# Patient Record
Sex: Female | Born: 2000 | Race: Black or African American | Hispanic: No | Marital: Single | Smoking: Former smoker
Health system: Southern US, Community
[De-identification: ages and names within clinical notes are randomized; demographics above are authoritative.]

## PROBLEM LIST (undated history)

## (undated) ENCOUNTER — Inpatient Hospital Stay (HOSPITAL_COMMUNITY): Payer: Self-pay

## (undated) DIAGNOSIS — R45851 Suicidal ideations: Secondary | ICD-10-CM

## (undated) DIAGNOSIS — J45909 Unspecified asthma, uncomplicated: Secondary | ICD-10-CM

## (undated) DIAGNOSIS — F913 Oppositional defiant disorder: Secondary | ICD-10-CM

## (undated) DIAGNOSIS — F329 Major depressive disorder, single episode, unspecified: Secondary | ICD-10-CM

## (undated) HISTORY — PX: NO PAST SURGERIES: SHX2092

## (undated) HISTORY — PX: NASAL FRACTURE SURGERY: SHX718

---
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
## 2020-02-25 DIAGNOSIS — Z6379 Other stressful life events affecting family and household: Secondary | ICD-10-CM | POA: Diagnosis not present

## 2020-02-25 DIAGNOSIS — R399 Unspecified symptoms and signs involving the genitourinary system: Secondary | ICD-10-CM | POA: Diagnosis not present

## 2020-02-25 DIAGNOSIS — Z7251 High risk heterosexual behavior: Secondary | ICD-10-CM | POA: Diagnosis not present

## 2020-03-02 ENCOUNTER — Ambulatory Visit: Payer: Medicaid Other

## 2020-09-03 ENCOUNTER — Other Ambulatory Visit: Payer: Medicaid Other

## 2020-09-03 DIAGNOSIS — Z20822 Contact with and (suspected) exposure to covid-19: Secondary | ICD-10-CM

## 2020-09-05 LAB — SARS-COV-2, NAA 2 DAY TAT

## 2020-09-05 LAB — NOVEL CORONAVIRUS, NAA: SARS-CoV-2, NAA: NOT DETECTED

## 2020-09-15 IMAGING — US US MFM OB COMP + 14 WK
1 series · 13 of 28 positions shown · non-contrast
Comparison: none

[Series 1: us mfm ob comp + 14 wk · 13 of 78 slices shown]
[im 3/78]
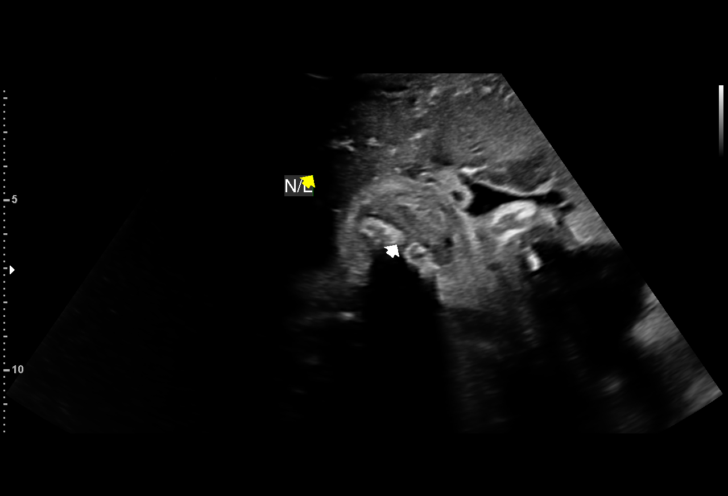
[im 9/78]
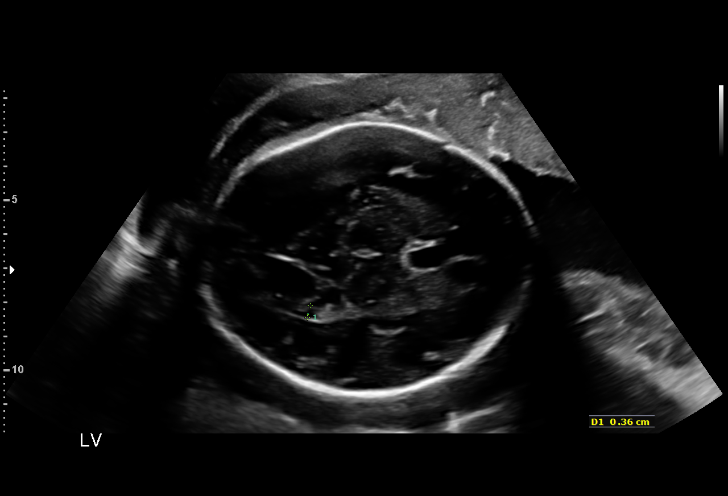
[im 15/78]
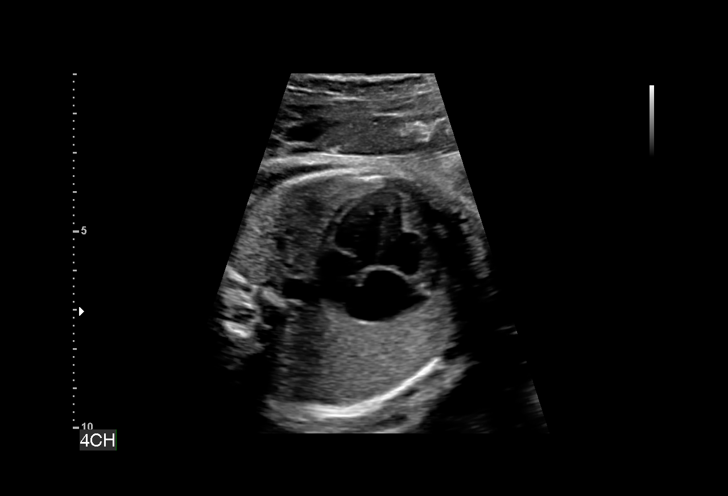
[im 20/78]
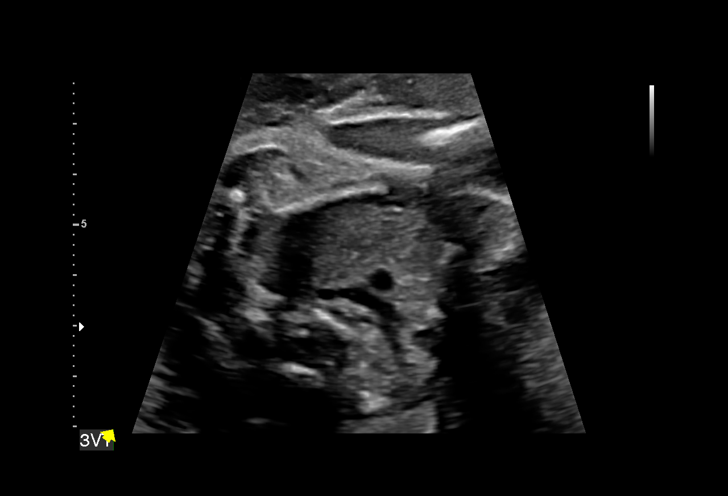
[im 26/78]
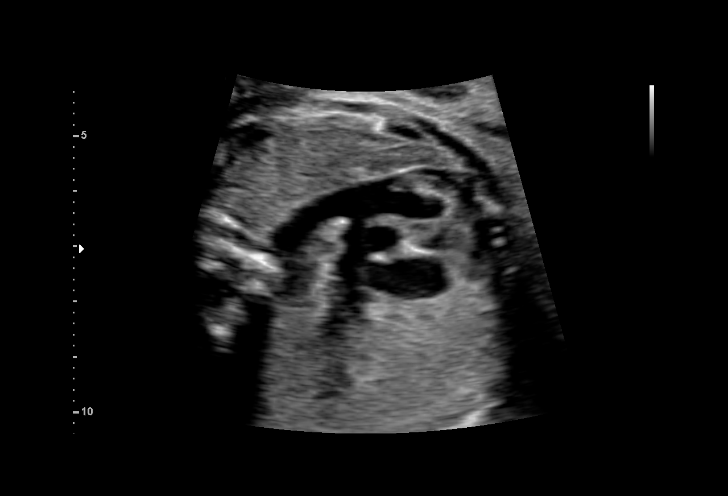
[im 32/78]
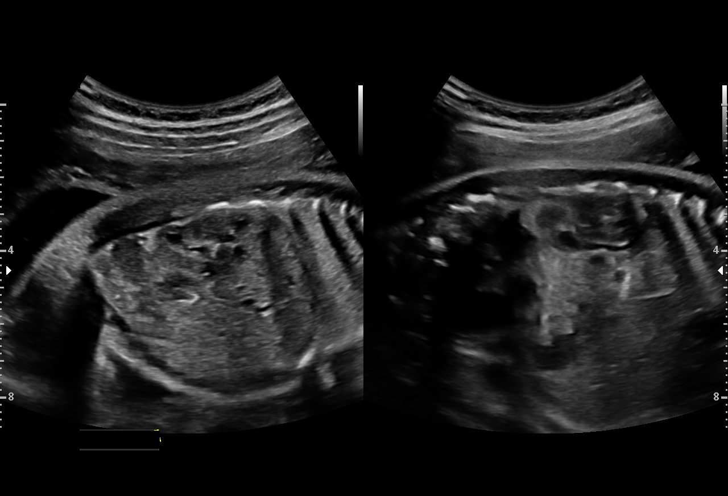
[im 40/78]
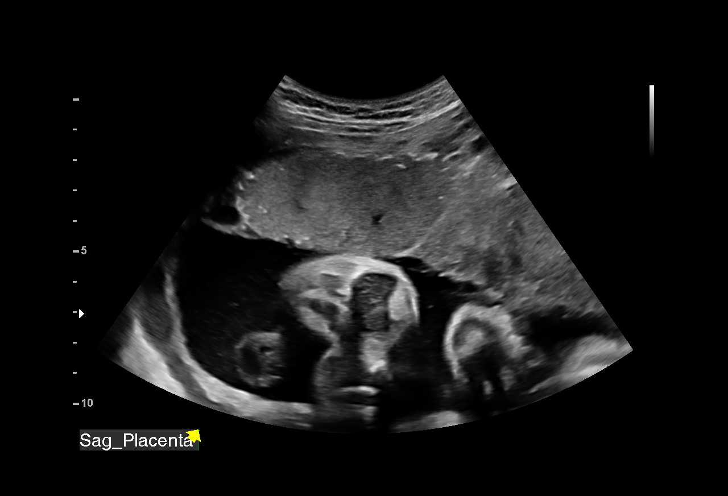
[im 46/78]
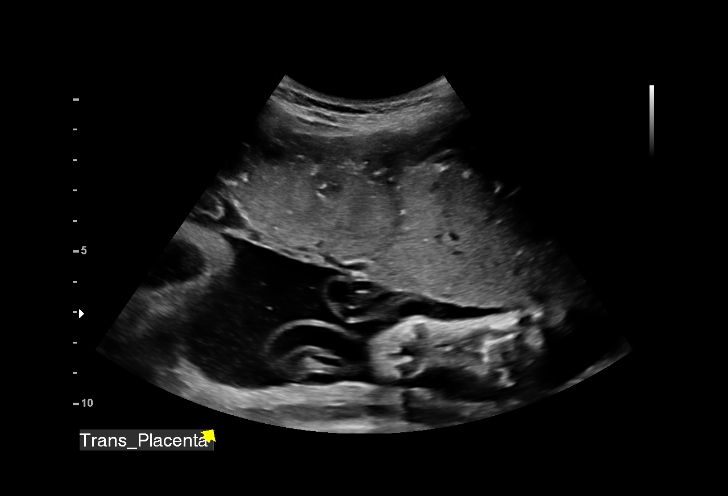
[im 52/78]
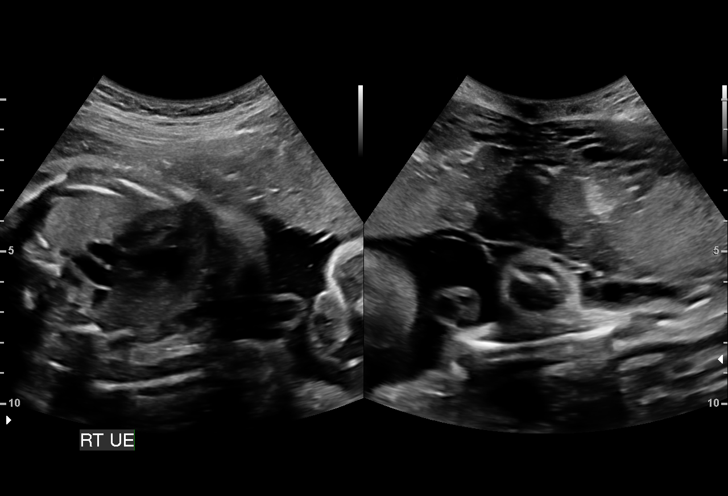
[im 58/78]
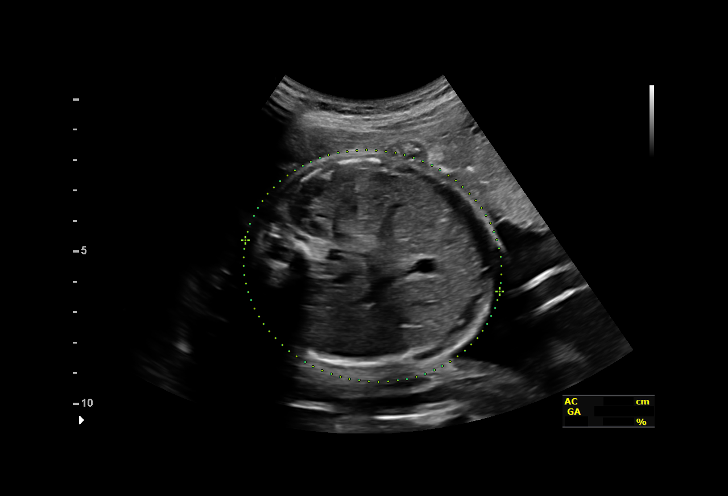
[im 63/78]
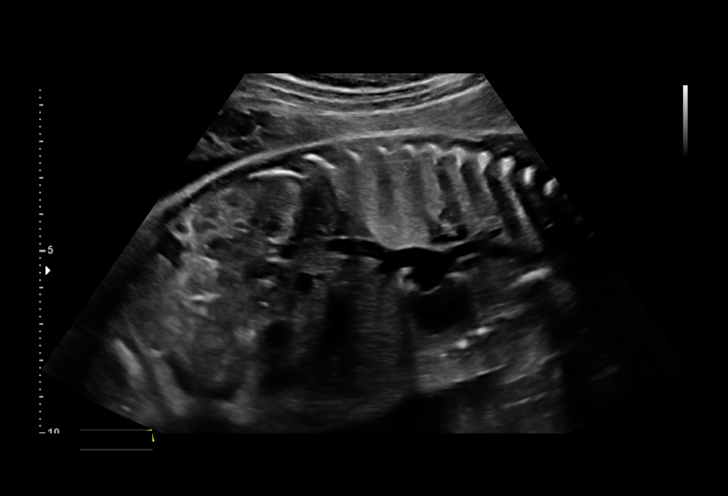
[im 69/78]
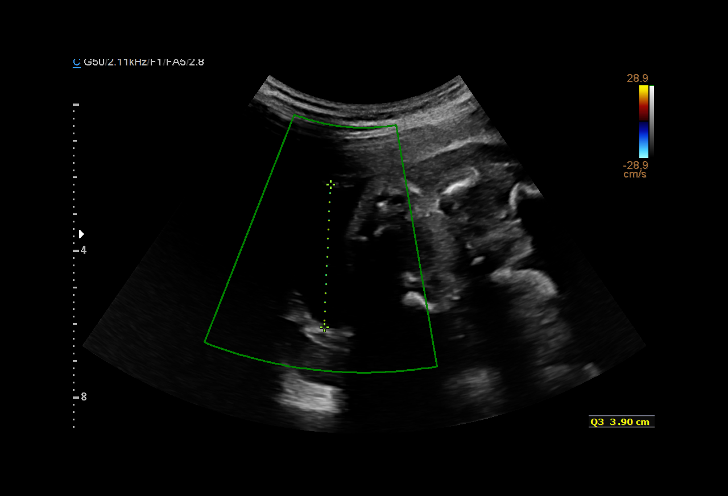
[im 75/78]
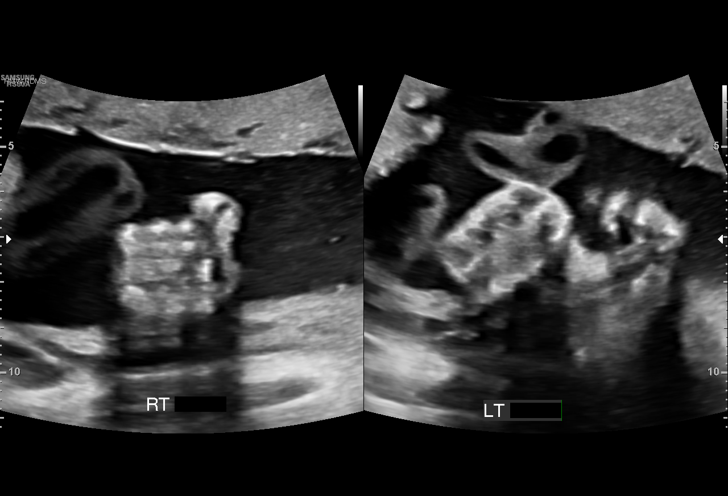

[13 of 28 positions shown; findings below may reference images not displayed]

----------------------------------------------------------------------

 ----------------------------------------------------------------------
Indications

  30 weeks gestation of pregnancy
  Encounter for antenatal screening for
  malformations (low risk NIPS)
  Teen pregnancy
  Insufficient Prenatal Care
 ----------------------------------------------------------------------
Fetal Evaluation

 Num Of Fetuses:         1
 Fetal Heart Rate(bpm):  128
 Cardiac Activity:       Observed
 Presentation:           Cephalic
 Placenta:               Anterior
 P. Cord Insertion:      Visualized, central

 Amniotic Fluid
 AFI FV:      Within normal limits

 AFI Sum(cm)     %Tile       Largest Pocket(cm)
 13.34           41

 RUQ(cm)       RLQ(cm)       LUQ(cm)        LLQ(cm)

Biometry

 BPD:      77.7  mm     G. Age:  31w 1d         71  %    CI:        74.02   %    70 - 86
                                                         FL/HC:      20.6   %    19.2 -
 HC:      286.8  mm     G. Age:  31w 3d         54  %    HC/AC:      1.13        0.99 -
 AC:      252.9  mm     G. Age:  29w 3d         26  %    FL/BPD:     75.9   %    71 - 87
 FL:         59  mm     G. Age:  30w 6d         54  %    FL/AC:      23.3   %    20 - 24
 HUM:      52.3  mm     G. Age:  30w 3d         59  %
 CER:      36.5  mm     G. Age:  31w 4d         65  %

 Est. FW:    7310  gm      3 lb 6 oz     55  %
OB History

 Gravidity:    1         Term:   0        Prem:   0        SAB:   0
 TOP:          0       Ectopic:  0        Living: 0
Gestational Age

 LMP:           30w 1d        Date:  09/02/18                 EDD:   06/09/19
 U/S Today:     30w 5d                                        EDD:   06/05/19
 Best:          30w 1d     Det. By:  LMP  (09/02/18)          EDD:   06/09/19
Anatomy

 Cranium:               Appears normal         Aortic Arch:            Appears normal
 Cavum:                 Appears normal         Ductal Arch:            Appears normal
 Ventricles:            Appears normal         Diaphragm:              Appears normal
 Choroid Plexus:        Appears normal         Stomach:                Appears normal, left
                                                                       sided
 Cerebellum:            Appears normal         Abdomen:                Appears normal
 Posterior Fossa:       Appears normal         Abdominal Wall:         Appears nml (cord
                                                                       insert, abd wall)
 Nuchal Fold:           Not applicable (>20    Cord Vessels:           Appears normal (3
                        wks GA)                                        vessel cord)
 Face:                  Appears normal         Kidneys:                Appear normal
                        (orbits and profile)
 Lips:                  Appears normal         Bladder:                Appears normal
 Thoracic:              Appears normal         Spine:                  Appears normal
 Heart:                 Appears normal         Upper Extremities:      Appears normal
                        (4CH, axis, and
                        situs)
 RVOT:                  Appears normal         Lower Extremities:      Appears normal
 LVOT:                  Appears normal

 Other:  3VV and 3VTV visualized. Hands visualized. Feet visualized.
Cervix Uterus Adnexa

 Cervix
 Normal appearance by transabdominal scan.

 Uterus
 No abnormality visualized.

 Left Ovary
 No adnexal mass visualized.

 Right Ovary
 No adnexal mass visualized.

 Cul De Sac
 No free fluid seen.

 Adnexa
 No abnormality visualized.
Impression

 Normal interval growth.  No ultrasonic evidence of structural
 fetal anomalies.
 Teen pregnancy
 Anatomy completed.
Recommendations

 Follow up as clinically indicated.

## 2020-10-27 DIAGNOSIS — J45901 Unspecified asthma with (acute) exacerbation: Secondary | ICD-10-CM | POA: Diagnosis not present

## 2020-11-11 DIAGNOSIS — N898 Other specified noninflammatory disorders of vagina: Secondary | ICD-10-CM | POA: Diagnosis not present

## 2020-11-11 DIAGNOSIS — Z113 Encounter for screening for infections with a predominantly sexual mode of transmission: Secondary | ICD-10-CM | POA: Diagnosis not present

## 2020-11-15 DIAGNOSIS — N760 Acute vaginitis: Secondary | ICD-10-CM | POA: Diagnosis not present

## 2020-11-15 DIAGNOSIS — B9689 Other specified bacterial agents as the cause of diseases classified elsewhere: Secondary | ICD-10-CM | POA: Diagnosis not present

## 2020-11-15 DIAGNOSIS — A5600 Chlamydial infection of lower genitourinary tract, unspecified: Secondary | ICD-10-CM | POA: Diagnosis not present

## 2021-01-10 DIAGNOSIS — Z7251 High risk heterosexual behavior: Secondary | ICD-10-CM | POA: Diagnosis not present

## 2021-01-10 DIAGNOSIS — R300 Dysuria: Secondary | ICD-10-CM | POA: Diagnosis not present

## 2021-01-10 DIAGNOSIS — R8290 Unspecified abnormal findings in urine: Secondary | ICD-10-CM | POA: Diagnosis not present

## 2021-01-10 DIAGNOSIS — Z202 Contact with and (suspected) exposure to infections with a predominantly sexual mode of transmission: Secondary | ICD-10-CM | POA: Diagnosis not present

## 2021-05-18 ENCOUNTER — Emergency Department (HOSPITAL_BASED_OUTPATIENT_CLINIC_OR_DEPARTMENT_OTHER)
Admission: EM | Admit: 2021-05-18 | Discharge: 2021-05-19 | Disposition: A | Discharge status: Home / Self Care | Payer: Medicaid Other | Source: Home / Self Care | Attending: Emergency Medicine | Admitting: Emergency Medicine

## 2021-05-18 ENCOUNTER — Emergency Department (HOSPITAL_BASED_OUTPATIENT_CLINIC_OR_DEPARTMENT_OTHER): Payer: Medicaid Other

## 2021-05-18 ENCOUNTER — Encounter (HOSPITAL_COMMUNITY): Payer: Self-pay | Admitting: Emergency Medicine

## 2021-05-18 ENCOUNTER — Encounter (HOSPITAL_BASED_OUTPATIENT_CLINIC_OR_DEPARTMENT_OTHER): Payer: Self-pay

## 2021-05-18 ENCOUNTER — Other Ambulatory Visit: Payer: Self-pay

## 2021-05-18 ENCOUNTER — Emergency Department (HOSPITAL_COMMUNITY)
Admission: EM | Admit: 2021-05-18 | Discharge: 2021-05-18 | Discharge status: Against Medical Advice | Payer: Medicaid Other | Attending: Emergency Medicine | Admitting: Emergency Medicine

## 2021-05-18 DIAGNOSIS — N838 Other noninflammatory disorders of ovary, fallopian tube and broad ligament: Secondary | ICD-10-CM

## 2021-05-18 DIAGNOSIS — J45909 Unspecified asthma, uncomplicated: Secondary | ICD-10-CM | POA: Insufficient documentation

## 2021-05-18 DIAGNOSIS — R1030 Lower abdominal pain, unspecified: Secondary | ICD-10-CM | POA: Diagnosis not present

## 2021-05-18 DIAGNOSIS — N83201 Unspecified ovarian cyst, right side: Secondary | ICD-10-CM | POA: Diagnosis not present

## 2021-05-18 DIAGNOSIS — R6883 Chills (without fever): Secondary | ICD-10-CM | POA: Insufficient documentation

## 2021-05-18 DIAGNOSIS — R1031 Right lower quadrant pain: Secondary | ICD-10-CM | POA: Insufficient documentation

## 2021-05-18 DIAGNOSIS — K5900 Constipation, unspecified: Secondary | ICD-10-CM | POA: Insufficient documentation

## 2021-05-18 DIAGNOSIS — G43909 Migraine, unspecified, not intractable, without status migrainosus: Secondary | ICD-10-CM | POA: Diagnosis not present

## 2021-05-18 DIAGNOSIS — Z7951 Long term (current) use of inhaled steroids: Secondary | ICD-10-CM | POA: Insufficient documentation

## 2021-05-18 DIAGNOSIS — Z5321 Procedure and treatment not carried out due to patient leaving prior to being seen by health care provider: Secondary | ICD-10-CM | POA: Insufficient documentation

## 2021-05-18 DIAGNOSIS — R102 Pelvic and perineal pain: Secondary | ICD-10-CM | POA: Diagnosis not present

## 2021-05-18 LAB — LIPASE, BLOOD: Lipase: 25 U/L (ref 11–51)

## 2021-05-18 LAB — COMPREHENSIVE METABOLIC PANEL
ALT: 10 U/L (ref 0–44)
AST: 13 U/L — ABNORMAL LOW (ref 15–41)
Albumin: 3.5 g/dL (ref 3.5–5.0)
Alkaline Phosphatase: 53 U/L (ref 38–126)
Anion gap: 8 (ref 5–15)
BUN: 6 mg/dL (ref 6–20)
CO2: 24 mmol/L (ref 22–32)
Calcium: 9 mg/dL (ref 8.9–10.3)
Chloride: 105 mmol/L (ref 98–111)
Creatinine, Ser: 0.7 mg/dL (ref 0.44–1.00)
GFR, Estimated: >60 mL/min (ref >60)
Glucose, Bld: 93 mg/dL (ref 70–99)
Potassium: 3.8 mmol/L (ref 3.5–5.1)
Sodium: 137 mmol/L (ref 135–145)
Total Bilirubin: 1.3 mg/dL — ABNORMAL HIGH (ref 0.3–1.2)
Total Protein: 7.4 g/dL (ref 6.5–8.1)

## 2021-05-18 LAB — CBC WITH DIFFERENTIAL/PLATELET
Abs Immature Granulocytes: 0.04 10*3/uL (ref 0.00–0.07)
Basophils Absolute: 0 10*3/uL (ref 0.0–0.1)
Basophils Relative: 0 %
Eosinophils Absolute: 0 10*3/uL (ref 0.0–0.5)
Eosinophils Relative: 0 %
HCT: 40.8 % (ref 36.0–46.0)
Hemoglobin: 12.8 g/dL (ref 12.0–15.0)
Immature Granulocytes: 1 %
Lymphocytes Relative: 17 %
Lymphs Abs: 1.4 10*3/uL (ref 0.7–4.0)
MCH: 27.3 pg (ref 26.0–34.0)
MCHC: 31.4 g/dL (ref 30.0–36.0)
MCV: 87 fL (ref 80.0–100.0)
Monocytes Absolute: 1.1 10*3/uL — ABNORMAL HIGH (ref 0.1–1.0)
Monocytes Relative: 13 %
Neutro Abs: 5.9 10*3/uL (ref 1.7–7.7)
Neutrophils Relative %: 69 %
Platelets: 312 10*3/uL (ref 150–400)
RBC: 4.69 MIL/uL (ref 3.87–5.11)
RDW: 14.7 % (ref 11.5–15.5)
WBC: 8.5 10*3/uL (ref 4.0–10.5)
nRBC: 0 % (ref 0.0–0.2)

## 2021-05-18 LAB — URINALYSIS, ROUTINE W REFLEX MICROSCOPIC
Bilirubin Urine: NEGATIVE
Glucose, UA: NEGATIVE mg/dL
Ketones, ur: 20 mg/dL — AB
Nitrite: NEGATIVE
Protein, ur: 30 mg/dL — AB
Specific Gravity, Urine: 1.029 (ref 1.005–1.030)
pH: 6 (ref 5.0–8.0)

## 2021-05-18 LAB — HCG, QUANTITATIVE, PREGNANCY: hCG, Beta Chain, Quant, S: 1 m[IU]/mL (ref <5)

## 2021-05-18 MED ORDER — ONDANSETRON HCL 4 MG/2ML IJ SOLN
4.0000 mg | Freq: Once | INTRAMUSCULAR | Status: AC
  Administered 2021-05-19: 4 mg via INTRAVENOUS
  Filled 2021-05-18: qty 2

## 2021-05-18 MED ORDER — ACETAMINOPHEN 500 MG PO TABS
1000.0000 mg | ORAL_TABLET | Freq: Once | ORAL | Status: AC
  Administered 2021-05-18: 1000 mg via ORAL
  Filled 2021-05-18: qty 2

## 2021-05-18 MED ORDER — MORPHINE SULFATE (PF) 4 MG/ML IV SOLN
4.0000 mg | Freq: Once | INTRAVENOUS | Status: AC
  Administered 2021-05-19: 4 mg via INTRAVENOUS
  Filled 2021-05-18: qty 1

## 2021-05-18 MED ORDER — IOHEXOL 300 MG/ML  SOLN
100.0000 mL | Freq: Once | INTRAMUSCULAR | Status: AC | PRN
  Administered 2021-05-18: 75 mL via INTRAVENOUS

## 2021-05-18 MED ORDER — SODIUM CHLORIDE 0.9 % IV SOLN: Freq: Once | INTRAVENOUS | Status: AC

## 2021-05-18 NOTE — ED Triage Notes (Signed)
Pt endorses lower abd pain and migraine since Monday. Last BM yesterday.

## 2021-05-18 NOTE — ED Provider Notes (Signed)
Emergency Medicine Provider Triage Evaluation Note  Dawn Hudson , a 20 y.o. female  was evaluated in triage.  Pt complains of lower abdominal pain since Sunday.  Pain is worse with patient, patient.  This weekend.  She is febrile, but denies any chills.  No associated nausea, vomiting, hematuria, dysuria.  No previous abdominal surgeries.  Review of Systems  Positive: Abdominal pain  Negative: above  Physical Exam  BP 114/63 (BP Location: Right Arm)   Pulse 92   Temp (!) 100.5 F (38.1 C) (Oral)   Resp 18   Ht 5\' 3"  (1.6 m)   Wt 52.6 kg   SpO2 98%   BMI 20.55 kg/m  Gen:   Awake, no distress   Resp:  Normal effort  MSK:   Moves extremities without difficulty  Other:  Referral quadrant, right lower quadrant, suprapubic tenderness.  No CVA tenderness  Medical Decision Making  Medically screening exam initiated at 11:44 AM.  Appropriate orders placed.  Dawn Hudson was informed that the remainder of the evaluation will be completed by another provider, this initial triage assessment does not replace that evaluation, and the importance of remaining in the ED until their evaluation is complete.     , PA-C 05/18/21 1146    05/20/21, MD 05/19/21 [phone]

## 2021-05-18 NOTE — ED Notes (Signed)
Triage RN notified of pts BP.

## 2021-05-18 NOTE — ED Provider Notes (Signed)
[facility] EMERGENCY DEPARTMENT Provider Note   CSN: [id number] Arrival date & time: 05/18/21  1951     History Chief Complaint  Patient presents with   Abdominal Pain    Dawn Hudson is a 20 y.o. female with medical history as noted below.  Patient presents emergency department with a chief complaint of abdominal pain.  Patient states that abdominal pain is located to lower left and lower right quadrant.  Pain started on Sunday 7/24.  Pain has been constant since then.  Pain has been unchanged.  Patient rates pain 9/10 on the pain scale.  Patient describes pain as a "aching."  Patient reports that pain is worse with movement.  Patient has not tried any modalities to alleviate her pain.  Patient endorses associated decreased appetite, chills and constipation.  Last bowel movement occurred last Wednesday.  Patient states that she has been passing flatus.  Patient denies any history of abdominal surgeries.  G1 P1-0-0-1.  LMP 7/20  Patient denies any dysuria, hematuria, urinary frequency, vaginal pain, vaginal bleeding, vaginal discharge, nausea, vomiting, diarrhea, chest pain, shortness of breath.    Patient reports that she went to [facility] emergency department earlier today where she had lab work drawn.  Patient left prior to being seen by a provider.   Abdominal Pain Associated symptoms: chills and constipation   Associated symptoms: no chest pain, no diarrhea, no dysuria, no fever, no hematuria, no nausea, no shortness of breath, no vaginal bleeding, no vaginal discharge and no vomiting       Past Medical History:  Diagnosis Date   Asthma    Depressive disorder 04/25/2017   Oppositional defiant disorder, moderate 04/25/2017   Suicidal ideation 04/25/2017    Patient Active Problem List   Diagnosis Date Noted   Post-dates pregnancy 06/08/2019   Encounter for supervision of normal first pregnancy in first trimester 12/03/2018   DMDD (disruptive mood dysregulation disorder)  (HCC) 04/25/2017   Depressive disorder 04/25/2017   Oppositional defiant disorder, moderate 04/25/2017   Suicidal ideation 04/25/2017    Past Surgical History:  Procedure Laterality Date   NASAL FRACTURE SURGERY     NO PAST SURGERIES       OB History     Gravida  1   Para  1   Term  1   Preterm      AB      Living  1      SAB      IAB      Ectopic      Multiple  0   Live Births  1           Family History  Problem Relation Age of Onset   Healthy Mother    Healthy Father    Cancer Neg Hx    Diabetes Neg Hx    Hypertension Neg Hx     Social History   Tobacco Use   Smoking status: Never   Smokeless tobacco: Never  Vaping Use   Vaping Use: Never used  Substance Use Topics   Alcohol use: No   Drug use: No    Home Medications Prior to Admission medications   Medication Sig Start Date End Date Taking? Authorizing Provider  albuterol (PROVENTIL HFA;VENTOLIN HFA) 108 (90 Base) MCG/ACT inhaler Inhale 1-2 puffs into the lungs every 6 (six) hours as needed for wheezing or shortness of breath. 04/16/16   Palumbo, April, MD  beclomethasone (QVAR) 80 MCG/ACT inhaler Inhale 2 puffs into the  lungs 2 (two) times daily.    [provider]  cetirizine (ZYRTEC) 10 MG tablet TK 1 T PO D 08/27/18   [provider]  Elastic Bandages & Supports (COMFORT FIT MATERNITY SUPP SM) MISC 1 Units by Does not apply route daily as needed. 05/14/19   Raelyn Mora, CNM  fluticasone (FLONASE) 50 MCG/ACT nasal spray INHALE 1 PUFFS IN EACH NOSTRIL BID 07/08/18   [provider]  ibuprofen (ADVIL) 600 MG tablet Take 1 tablet (600 mg total) by mouth every 6 (six) hours as needed for moderate pain. 06/10/19   Sharyon Cable, CNM  Prenat w/o A-FE-Methfol-FA-DHA (PRENATE DHA) 28-0.6-0.4-300 MG CAPS Take 1 capsule by mouth daily. Patient not taking: Reported on 12/16/2019 11/05/18   Aviva Signs, CNM  Prenat-FeAsp-Meth-FA-DHA w/o A (PRENATE DHA)  18-0.6-0.4-300 MG CAPS TK ONE C PO D 11/18/18   [provider]  sodium chloride (OCEAN) 0.65 % SOLN nasal spray Place 1 spray into both nostrils as needed for congestion. 11/15/18   Calvert Cantor, CNM  SYMBICORT 80-4.5 MCG/ACT inhaler INHALE 2 PUFFS PO BID IN THE MORNING AND EVENING 07/08/18   [provider]    Allergies    Patient has no known allergies.  Review of Systems   Review of Systems  Constitutional:  Positive for appetite change (decreased) and chills. Negative for fever.  Eyes:  Negative for visual disturbance.  Respiratory:  Negative for shortness of breath.   Cardiovascular:  Negative for chest pain.  Gastrointestinal:  Positive for abdominal pain and constipation. Negative for abdominal distention, anal bleeding, diarrhea, nausea and vomiting.  Genitourinary:  Negative for difficulty urinating, dysuria, flank pain, frequency, hematuria, vaginal bleeding, vaginal discharge and vaginal pain.  Musculoskeletal:  Negative for back pain and neck pain.  Skin:  Negative for color change and rash.  Neurological:  Negative for dizziness, syncope, light-headedness and headaches.  Psychiatric/Behavioral:  Negative for confusion.    Physical Exam Updated Vital Signs BP 122/70 (BP Location: Right Arm)   Pulse 97   Temp 99.8 F (37.7 C) (Oral)   Resp 18   Ht 5\' 3"  (1.6 m)   Wt 53.5 kg   LMP 05/11/2021   SpO2 100%   BMI 20.90 kg/m   Physical Exam Vitals and nursing note reviewed.  Constitutional:      General: She is not in acute distress.    Appearance: She is not ill-appearing, toxic-appearing or diaphoretic.  HENT:     Head: Normocephalic.  Eyes:     General: No scleral icterus.       Right eye: No discharge.        Left eye: No discharge.  Cardiovascular:     Rate and Rhythm: Normal rate.  Pulmonary:     Effort: Pulmonary effort is normal. No tachypnea, bradypnea or respiratory distress.  Abdominal:     General: Bowel sounds are normal.  There is no distension. There are no signs of injury.     Palpations: Abdomen is soft. There is no mass or pulsatile mass.     Tenderness: There is abdominal tenderness in the right lower quadrant and left lower quadrant. There is no right CVA tenderness, left CVA tenderness, guarding or rebound.     Hernia: There is no hernia in the umbilical area or ventral area.     Comments: Tenderness to right lower left lower quadrant.  Pain is worse to right lower quadrant.  Skin:    General: Skin is warm and  dry.  Neurological:     General: No focal deficit present.     Mental Status: She is alert.  Psychiatric:        Behavior: Behavior is cooperative.    ED Results / Procedures / Treatments   Labs (all labs ordered are listed, but only abnormal results are displayed) Labs Reviewed - No data to display     EKG None  Radiology CT ABDOMEN PELVIS W CONTRAST  Result Date: 05/18/2021 CLINICAL DATA:  Right lower quadrant pain, constipation and urinary frequency EXAM: CT ABDOMEN AND PELVIS WITH CONTRAST TECHNIQUE: Multidetector CT imaging of the abdomen and pelvis was performed using the standard protocol following bolus administration of intravenous contrast. CONTRAST:  75mL OMNIPAQUE IOHEXOL 300 MG/ML  SOLN COMPARISON:  None FINDINGS: Lower chest: No acute abnormality in the upper chest or imaged lung apices. Hepatobiliary: No worrisome focal liver lesions. Smooth liver surface contour. Normal hepatic attenuation. Normal gallbladder and biliary tree. Pancreas: No pancreatic ductal dilatation or surrounding inflammatory changes. Spleen: Normal in size. No concerning splenic lesions. Adrenals/Urinary Tract: Normal adrenal glands. Kidneys are normally located with symmetric enhancement. No suspicious renal lesion, urolithiasis or hydronephrosis. Urinary bladder is largely decompressed at the time of exam and therefore poorly evaluated by CT imaging. Mild bladder wall thickening likely related to  underdistention. Stomach/Bowel: Challenging assessment of the bowel and mesentery given a paucity of mesenteric fat and mass effect from the large fluid structure in the pelvis. Distal esophagus, stomach and duodenal sweep are unremarkable. No small bowel wall thickening or dilatation. No evidence of obstruction. Normal appearing air-filled appendix seen coiling in retrocecal position from the cecal tip, along the inferior margin of the right kidney. No focal appendiceal inflammation to suggest acute appendicitis. No colonic dilatation or wall thickening. Compression of the sigmoid colon by the large pelvic structure. No high-grade bowel obstruction is seen. Vascular/Lymphatic: No significant vascular findings are present. No visible enlarged abdominal or pelvic lymph nodes with evaluation complicated by the absence of intraperitoneal fat. Reproductive: Large fluid attenuation structure in the pelvis which appears to extend from the right adnexa measuring approximately 14.3 x 10.9 x 9.5 cm in maximum dimensions with slightly elongated projection anteriorly which could reflect a tubular origin such as a large hydrosalpinx however this is incompletely characterized on this exam. Furthermore, there is surrounding thickening and edematous changes as well as prominence of the right parametrial vasculature including several vessels which demonstrates some questionable thickening particular coronal reconstructions (46-55). Uterus is displaced left laterally. No concerning left adnexal lesion. Other: Small to moderate volume low-attenuation free fluid in the deep pelvis and right adnexa. Inflammatory changes adjacent the large fluid attenuation structure in the pelvis as well. Displacement of the pelvic viscera. No bowel containing hernia. Musculoskeletal: No acute osseous abnormality or suspicious osseous lesion. IMPRESSION: Large fluid attenuation structure which appears to arise from the right adnexa demonstrates some  questionable tubular appearance anteriorly, could reflect a large hydrosalpinx though other cystic adnexal masses/cystic ovarian neoplasm is not fully excluded. Is incompletely characterized on this exam. Furthermore, there is some notable prominence of the right parametrial vasculature and questionable twisting of the vessels in the right adnexa. Torsion is not fully excluded particularly given the setting of acute onset pelvic pain. Consider further interrogation with pelvic ultrasound with Doppler though ultimately the structure itself may require more complete characterization with contrast enhanced MR imaging. Displacement of the pelvic viscera and some compression of the distal sigmoid colon albeit without high-grade obstruction. Normal appendix.  These results were called by telephone at the time of interpretation on 05/18/2021 at 11:28 pm to provider [facility] , who verbally acknowledged these results. Electronically Signed   By: Kreg Shropshire M.D.   On: 05/18/2021 23:28    Procedures Procedures   Medications Ordered in ED Medications - No data to display  ED Course  I have reviewed the triage vital signs and the nursing notes.  Pertinent labs & imaging results that were available during my care of the patient were reviewed by me and considered in my medical decision making (see chart for details).  Clinical Course as of 05/19/21 0026  Wed May 18, 2021  2324 Spoke to radiology; [PB]    Clinical Course User Index [PB] Berneice Heinrich   MDM Rules/Calculators/A&P                           Alert 20 year old female no acute distress, nontoxic-appearing.  Presents with chief complaint of lower quadrant abdominal pain.  Pain has been present since 7/25.  Pain has been unchanged since then.  [facility] and had lab work obtained.  Patient left before being seen by provider.  CBC shows no leukocytosis or anemia CMP is  unremarkable Lipase within normal limits Quantitative hCG less than 1 UA shows bacteria few, squamous epithelial 11-20, WBC 6-10, RBC 11-20, leukocytes trace, nitrite negative.  Patient denies any urinary symptoms, suspect that few bacteria seen is due to contamination.  Due to patient's tenderness to right lower quadrant on exam concern for possible appendicitis.  Will obtain CT abdomen pelvis with contrast.  Patient was given Tylenol for pain management.  Patient was offered opiates however refused at this time.  CT of abdomen and pelvis shows: Large fluid attenuation structure in the pelvis which appears to extend from the right adnexa measuring approximately 14.3 x 10.9 x 9.5 cm in maximum dimensions. This could represent large hydrosalpinx however other cystic dental masses, or cystic ovarian neoplasm is not excluded.  Will order ultrasound imaging to rule out ovarian torsion as well as gain better characterization of mass.  Spoke to on-call OB/GYN provider Dr. Arlyn Leak, who reported that patient can follow-up with this clinic in the outpatient setting.    Patient care transferred to Dr.Floyd at the end of my shift. Patient presentation, ED course, and plan of care discussed with review of all pertinent labs and imaging. Please see his/her note for further details regarding further ED course and disposition.   Final Clinical Impression(s) / ED Diagnoses Final diagnoses:  None    Rx / DC Orders ED Discharge Orders     None        Haskel Schroeder, PA-C 05/19/21 0036    Melene Plan, DO 05/19/21 0147

## 2021-05-18 NOTE — ED Triage Notes (Addendum)
Pt c/o lower abd pain, constipation, urinary freq-sx started 7/24-NAD-steady gait-pt states she LWBS [place]-results in epic

## 2021-05-19 DIAGNOSIS — N83201 Unspecified ovarian cyst, right side: Secondary | ICD-10-CM | POA: Diagnosis not present

## 2021-05-19 DIAGNOSIS — R102 Pelvic and perineal pain: Secondary | ICD-10-CM | POA: Diagnosis not present

## 2021-05-19 MED ORDER — MORPHINE SULFATE 15 MG PO TABS: 7.5000 mg | ORAL_TABLET | ORAL | 0 refills | Status: DC | PRN

## 2021-05-19 NOTE — Discharge Instructions (Addendum)

## 2021-05-20 ENCOUNTER — Telehealth: Payer: Self-pay

## 2021-05-20 NOTE — Telephone Encounter (Signed)
Transition Care Management Follow-up Telephone Call Date of discharge and from where: 05/19/2021-[facility] How have you been since you were released from the hospital? Patient stated she is still in pain. Any questions or concerns? Yes patient stated she is suppose to be scheduled for surgery but there is no appointment scheduled. Patient was advised to follow up with OBGYN  per ED discharge note.   Items Reviewed: Did the pt receive and understand the discharge instructions provided? Yes  Medications obtained and verified? Yes  Other? No  Any new allergies since your discharge? No  Dietary orders reviewed? N/A Do you have support at home? Yes   Home Care and Equipment/Supplies: Were home health services ordered? not applicable If so, what is the name of the agency? N/A  Has the agency set up a time to come to the patient's home? not applicable Were any new equipment or medical supplies ordered?  No What is the name of the medical supply agency? N/A Were you able to get the supplies/equipment? not applicable Do you have any questions related to the use of the equipment or supplies? No  Functional Questionnaire: (I = Independent and D = Dependent) ADLs: I  Bathing/Dressing- I  Meal Prep- I  Eating- I  Maintaining continence- I  Transferring/Ambulation- I  Managing Meds- I  Follow up appointments reviewed:  PCP Hospital f/u appt confirmed? No   Specialist Hospital f/u appt confirmed? No   Are transportation arrangements needed? No  If their condition worsens, is the pt aware to call PCP or go to the Emergency Dept.? Yes Was the patient provided with contact information for the PCP's office or ED? Yes Was to pt encouraged to call back with questions or concerns? Yes

## 2021-06-06 ENCOUNTER — Encounter: Payer: Self-pay | Admitting: Obstetrics & Gynecology

## 2021-06-06 ENCOUNTER — Ambulatory Visit (INDEPENDENT_AMBULATORY_CARE_PROVIDER_SITE_OTHER): Payer: Medicaid Other | Admitting: Obstetrics & Gynecology

## 2021-06-06 ENCOUNTER — Other Ambulatory Visit: Payer: Self-pay

## 2021-06-06 VITALS — BP 177/56 | HR 84 | Ht 63.0 in | Wt 121.7 lb

## 2021-06-06 DIAGNOSIS — R1900 Intra-abdominal and pelvic swelling, mass and lump, unspecified site: Secondary | ICD-10-CM

## 2021-06-06 NOTE — Progress Notes (Signed)
Patient ID: Dawn Hudson, female   DOB: 14-May-2001, 20 y.o.   MRN: [id number]  Chief Complaint  Patient presents with   Gynecologic Exam    HPI Dawn Hudson is a 20 y.o. female.  F/u after ED visit for pelvic pain with CT and US showing large fluid filled pelvic mass on the right. Her pain lasted a week and has resolved. HPI  Past Medical History:  Diagnosis Date   Asthma    Depressive disorder 04/25/2017   Oppositional defiant disorder, moderate 04/25/2017   Suicidal ideation 04/25/2017    Past Surgical History:  Procedure Laterality Date   NASAL FRACTURE SURGERY     NO PAST SURGERIES      Family History  Problem Relation Age of Onset   Healthy Mother    Healthy Father    Cancer Neg Hx    Diabetes Neg Hx    Hypertension Neg Hx     Social History Social History   Tobacco Use   Smoking status: Never   Smokeless tobacco: Never  Vaping Use   Vaping Use: Never used  Substance Use Topics   Alcohol use: No   Drug use: No    No Known Allergies  Current Outpatient Medications  Medication Sig Dispense Refill   albuterol (PROVENTIL HFA;VENTOLIN HFA) 108 (90 Base) MCG/ACT inhaler Inhale 1-2 puffs into the lungs every 6 (six) hours as needed for wheezing or shortness of breath. 1 Inhaler 0   beclomethasone (QVAR) 80 MCG/ACT inhaler Inhale 2 puffs into the lungs 2 (two) times daily.     cetirizine (ZYRTEC) 10 MG tablet TK 1 T PO D     fluticasone (FLONASE) 50 MCG/ACT nasal spray INHALE 1 PUFFS IN EACH NOSTRIL BID     ibuprofen (ADVIL) 600 MG tablet Take 1 tablet (600 mg total) by mouth every 6 (six) hours as needed for moderate pain. 30 tablet 0   morphine (MSIR) 15 MG tablet Take 0.5 tablets (7.5 mg total) by mouth every 4 (four) hours as needed for severe pain. 5 tablet 0   sodium chloride (OCEAN) 0.65 % SOLN nasal spray Place 1 spray into both nostrils as needed for congestion. 1 Bottle 0   SYMBICORT 80-4.5 MCG/ACT inhaler INHALE 2 PUFFS PO BID IN THE MORNING AND EVENING      Elastic Bandages & Supports (COMFORT FIT MATERNITY SUPP SM) MISC 1 Units by Does not apply route daily as needed. (Patient not taking: Reported on 06/06/2021) 1 each 0   nitrofurantoin, macrocrystal-monohydrate, (MACROBID) 100 MG capsule Take 100 mg by mouth 2 (two) times daily.     Prenat w/o A-FE-Methfol-FA-DHA (PRENATE DHA) 28-0.6-0.4-300 MG CAPS Take 1 capsule by mouth daily. (Patient not taking: No sig reported) 30 capsule 12   Prenat-FeAsp-Meth-FA-DHA w/o A (PRENATE DHA) 18-0.6-0.4-300 MG CAPS TK ONE C PO D (Patient not taking: Reported on 06/06/2021)     Current Facility-Administered Medications  Medication Dose Route Frequency Provider Last Rate Last Admin   medroxyPROGESTERone (DEPO-PROVERA) injection 150 mg  150 mg Intramuscular Q90 days Raelyn Mora, CNM   150 mg at 12/16/19 1101    Review of Systems Review of Systems  Respiratory: Negative.    Gastrointestinal: Negative.   Genitourinary:  Positive for frequency. Negative for pelvic pain, vaginal bleeding and vaginal discharge.   Blood pressure (!) 177/56, pulse 84, height 5\' 3"  (1.6 m), weight 121 lb 11.2 oz (55.2 kg), last menstrual period 05/13/2021, not currently breastfeeding.  Physical Exam Physical Exam Vitals and nursing note  reviewed. Exam conducted with a chaperone present.  Constitutional:      Appearance: Normal appearance.  Pulmonary:     Effort: Pulmonary effort is normal.  Abdominal:     General: Abdomen is flat. There is no distension.     Palpations: Abdomen is soft. There is no mass.     Tenderness: There is no abdominal tenderness.  Genitourinary:    General: Normal vulva.     Exam position: Lithotomy position.     Uterus: Normal.      Adnexa: Left adnexa normal.       Right: Mass present.   Neurological:     Mental Status: She is alert.    Data Reviewed Narrative & Impression  CLINICAL DATA:  Right lower quadrant pain, constipation and urinary frequency   EXAM: CT ABDOMEN AND PELVIS WITH  CONTRAST   TECHNIQUE: Multidetector CT imaging of the abdomen and pelvis was performed using the standard protocol following bolus administration of intravenous contrast.   CONTRAST:  6mL OMNIPAQUE IOHEXOL 300 MG/ML  SOLN   COMPARISON:  None   FINDINGS: Lower chest: No acute abnormality in the upper chest or imaged lung apices.   Hepatobiliary: No worrisome focal liver lesions. Smooth liver surface contour. Normal hepatic attenuation. Normal gallbladder and biliary tree.   Pancreas: No pancreatic ductal dilatation or surrounding inflammatory changes.   Spleen: Normal in size. No concerning splenic lesions.   Adrenals/Urinary Tract: Normal adrenal glands. Kidneys are normally located with symmetric enhancement. No suspicious renal lesion, urolithiasis or hydronephrosis. Urinary bladder is largely decompressed at the time of exam and therefore poorly evaluated by CT imaging. Mild bladder wall thickening likely related to underdistention.   Stomach/Bowel: Challenging assessment of the bowel and mesentery given a paucity of mesenteric fat and mass effect from the large fluid structure in the pelvis. Distal esophagus, stomach and duodenal sweep are unremarkable. No small bowel wall thickening or dilatation. No evidence of obstruction. Normal appearing air-filled appendix seen coiling in retrocecal position from the cecal tip, along the inferior margin of the right kidney. No focal appendiceal inflammation to suggest acute appendicitis. No colonic dilatation or wall thickening. Compression of the sigmoid colon by the large pelvic structure. No high-grade bowel obstruction is seen.   Vascular/Lymphatic: No significant vascular findings are present. No visible enlarged abdominal or pelvic lymph nodes with evaluation complicated by the absence of intraperitoneal fat.   Reproductive: Large fluid attenuation structure in the pelvis which appears to extend from the right adnexa  measuring approximately 14.3 x 10.9 x 9.5 cm in maximum dimensions with slightly elongated projection anteriorly which could reflect a tubular origin such as a large hydrosalpinx however this is incompletely characterized on this exam. Furthermore, there is surrounding thickening and edematous changes as well as prominence of the right parametrial vasculature including several vessels which demonstrates some questionable thickening particular coronal reconstructions (46-55). Uterus is displaced left laterally. No concerning left adnexal lesion.   Other: Small to moderate volume low-attenuation free fluid in the deep pelvis and right adnexa. Inflammatory changes adjacent the large fluid attenuation structure in the pelvis as well. Displacement of the pelvic viscera. No bowel containing hernia.   Musculoskeletal: No acute osseous abnormality or suspicious osseous lesion.   IMPRESSION: Large fluid attenuation structure which appears to arise from the right adnexa demonstrates some questionable tubular appearance anteriorly, could reflect a large hydrosalpinx though other cystic adnexal masses/cystic ovarian neoplasm is not fully excluded. Is incompletely characterized on this exam. Furthermore, there is  some notable prominence of the right parametrial vasculature and questionable twisting of the vessels in the right adnexa. Torsion is not fully excluded particularly given the setting of acute onset pelvic pain. Consider further interrogation with pelvic ultrasound with Doppler though ultimately the structure itself may require more complete characterization with contrast enhanced MR imaging.   Displacement of the pelvic viscera and some compression of the distal sigmoid colon albeit without high-grade obstruction.   Normal appendix.   These results were called by telephone at the time of interpretation on 05/18/2021 at 11:28 pm to provider [facility] , who verbally  acknowledged these results.     Electronically Signed   By: Kreg Shropshire M.D.   On: 05/18/2021 23:28      Assessment Pelvic mass in female - Plan: CA 125, MR PELVIS W WO CONTRAST, CANCELED: MR PELVIS W CONTRAST  Large fluid attenuation structure which appears to arise from the right adnexa demonstrates some questionable tubular appearance anteriorly, could reflect a large hydrosalpinx though other cystic adnexal masses/cystic ovarian neoplasm is not fully excluded.  Plan F/U with CA125 and MRI, RTC to review and consider surgical management vs. Possible onc consult    Scheryl Darter 06/06/2021, 11:53 AM

## 2021-06-06 NOTE — Progress Notes (Signed)
Patient is here for a follow from ED visit. Patient stated that she was having pain on "right side of pelvic. Patient stated that she has not had any vaginal bleeding

## 2021-06-07 LAB — CA 125: Cancer Antigen (CA) 125: 13.4 U/mL (ref 0.0–38.1)

## 2021-06-11 ENCOUNTER — Ambulatory Visit (HOSPITAL_COMMUNITY): Payer: Medicaid Other

## 2021-06-16 ENCOUNTER — Ambulatory Visit (HOSPITAL_COMMUNITY)
Admission: RE | Admit: 2021-06-16 | Discharge: 2021-06-16 | Disposition: A | Discharge status: Home / Self Care | Payer: Medicaid Other | Source: Ambulatory Visit | Attending: Obstetrics & Gynecology | Admitting: Obstetrics & Gynecology

## 2021-06-16 ENCOUNTER — Other Ambulatory Visit: Payer: Self-pay

## 2021-06-16 DIAGNOSIS — N7011 Chronic salpingitis: Secondary | ICD-10-CM | POA: Diagnosis not present

## 2021-06-16 DIAGNOSIS — R188 Other ascites: Secondary | ICD-10-CM | POA: Diagnosis not present

## 2021-06-16 DIAGNOSIS — R1900 Intra-abdominal and pelvic swelling, mass and lump, unspecified site: Secondary | ICD-10-CM | POA: Insufficient documentation

## 2021-06-16 DIAGNOSIS — N838 Other noninflammatory disorders of ovary, fallopian tube and broad ligament: Secondary | ICD-10-CM | POA: Diagnosis not present

## 2021-06-16 DIAGNOSIS — N83291 Other ovarian cyst, right side: Secondary | ICD-10-CM | POA: Diagnosis not present

## 2021-06-16 MED ORDER — GADOBUTROL 1 MMOL/ML IV SOLN
5.0000 mL | Freq: Once | INTRAVENOUS | Status: AC | PRN
  Administered 2021-06-16: 5 mL via INTRAVENOUS

## 2021-07-04 ENCOUNTER — Ambulatory Visit (INDEPENDENT_AMBULATORY_CARE_PROVIDER_SITE_OTHER): Payer: Medicaid Other | Admitting: Obstetrics & Gynecology

## 2021-07-04 ENCOUNTER — Encounter: Payer: Self-pay | Admitting: Obstetrics & Gynecology

## 2021-07-04 ENCOUNTER — Other Ambulatory Visit: Payer: Self-pay

## 2021-07-04 VITALS — BP 123/72 | HR 97 | Wt 127.2 lb

## 2021-07-04 DIAGNOSIS — N83201 Unspecified ovarian cyst, right side: Secondary | ICD-10-CM

## 2021-07-04 NOTE — Progress Notes (Signed)
Patient ID: Dawn Hudson, female   DOB: [birth year], 20 y.o.   MRN: [id number]  Chief Complaint  Patient presents with   Results   F/u MRI for pelvic fluid collection HPI Dawn Hudson is a 20 y.o. female.  [id number] Patient's last menstrual period was 06/19/2021 (approximate). No c/o pain, MRI was done 8/25 and showed significant resolution of right adnexal fluid collection with decrease in right ovarian cyst HPI  Past Medical History:  Diagnosis Date   Asthma    Depressive disorder 04/25/2017   Oppositional defiant disorder, moderate 04/25/2017   Suicidal ideation 04/25/2017    Past Surgical History:  Procedure Laterality Date   NASAL FRACTURE SURGERY     NO PAST SURGERIES      Family History  Problem Relation Age of Onset   Healthy Mother    Healthy Father    Cancer Neg Hx    Diabetes Neg Hx    Hypertension Neg Hx     Social History Social History   Tobacco Use   Smoking status: Former    Types: Cigars    Quit date: 05/03/2021    Years since quitting: 0.1   Smokeless tobacco: Never  Vaping Use   Vaping Use: Never used  Substance Use Topics   Alcohol use: No   Drug use: No    No Known Allergies  Current Outpatient Medications  Medication Sig Dispense Refill   albuterol (PROVENTIL HFA;VENTOLIN HFA) 108 (90 Base) MCG/ACT inhaler Inhale 1-2 puffs into the lungs every 6 (six) hours as needed for wheezing or shortness of breath. 1 Inhaler 0   cetirizine (ZYRTEC) 10 MG tablet TK 1 T PO D     fluticasone (FLONASE) 50 MCG/ACT nasal spray INHALE 1 PUFFS IN EACH NOSTRIL BID     ibuprofen (ADVIL) 600 MG tablet Take 1 tablet (600 mg total) by mouth every 6 (six) hours as needed for moderate pain. 30 tablet 0   SYMBICORT 80-4.5 MCG/ACT inhaler INHALE 2 PUFFS PO BID IN THE MORNING AND EVENING     Current Facility-Administered Medications  Medication Dose Route Frequency Provider Last Rate Last Admin   medroxyPROGESTERone (DEPO-PROVERA) injection 150 mg  150 mg Intramuscular Q90  days Raelyn Mora, CNM   150 mg at 12/16/19 1101    Review of Systems Review of Systems  Constitutional: Negative.   Gastrointestinal: Negative.   Genitourinary: Negative.    Blood pressure 123/72, pulse 97, weight 127 lb 3.2 oz (57.7 kg), last menstrual period 06/19/2021.  Physical Exam Physical Exam Constitutional:      Appearance: Normal appearance.  Pulmonary:     Effort: Pulmonary effort is normal.  Abdominal:     General: Abdomen is flat.     Palpations: Abdomen is soft.  Neurological:     Mental Status: She is alert.  Psychiatric:        Mood and Affect: Mood normal.    Data Reviewed  Images reviewed by me today Narrative & Impression  CLINICAL DATA:  Right adnexal cystic mass on recent CT.   EXAM: MRI PELVIS WITHOUT AND WITH CONTRAST   TECHNIQUE: Multiplanar multisequence MR imaging of the pelvis was performed both before and after administration of intravenous contrast.   CONTRAST:  62mL GADAVIST GADOBUTROL 1 MMOL/ML IV SOLN   COMPARISON:  CT on 05/18/2021   FINDINGS: Lower Urinary Tract: No urinary bladder or urethral abnormality identified.   Bowel: Unremarkable pelvic bowel loops.   Vascular/Lymphatic: Unremarkable. No pathologically enlarged pelvic lymph nodes identified.  Reproductive:   -- Uterus: Measures 8.7 x 5.5 x 5.7 cm (volume = 140 cm^3). No fibroids or other masses identified. Cervix and vagina are unremarkable.   -- Right ovary: Aright adnexal cyst is seen which measures 5.4 x 3.9 cm, and shows mild thin peripheral rim enhancement. This has significantly decreased in size from 13.7 x 9.7 cm when remeasured in same planes. A mild right hydrosalpinx is also noted. Previously seen loculated fluid collection in right lower quadrant has resolved since previous study.   -- Left ovary: Appears normal. No ovarian or adnexal masses identified.   Other: Small amount of free fluid noted in dependent portion of pelvis.    Musculoskeletal:  Unremarkable.   IMPRESSION: 5.4 cm benign-appearing right adnexal cyst, significantly decreased in size compared to previous study.   Resolution of previously seen loculated fluid collection in right lower quadrant.   Mild right hydrosalpinx and small amount of free fluid in dependent pelvis.   Normal appearance of uterus and left ovary.     Electronically Signed   By: Danae Orleans M.D.   On: 06/17/2021 14:19    Assessment Right ovarian cyst benign, o/w resolution of pelvic fluid collection with possible residual right mild hydrosalpinx  Plan Routine f/u no need for further imaging    Scheryl Darter 07/04/2021, 11:04 AM

## 2021-08-23 DIAGNOSIS — N898 Other specified noninflammatory disorders of vagina: Secondary | ICD-10-CM | POA: Diagnosis not present

## 2021-08-23 DIAGNOSIS — Z202 Contact with and (suspected) exposure to infections with a predominantly sexual mode of transmission: Secondary | ICD-10-CM | POA: Diagnosis not present

## 2021-08-23 DIAGNOSIS — R21 Rash and other nonspecific skin eruption: Secondary | ICD-10-CM | POA: Diagnosis not present

## 2021-10-18 DIAGNOSIS — S99922A Unspecified injury of left foot, initial encounter: Secondary | ICD-10-CM | POA: Diagnosis not present

## 2021-10-18 DIAGNOSIS — M79672 Pain in left foot: Secondary | ICD-10-CM | POA: Diagnosis not present

## 2021-10-19 ENCOUNTER — Telehealth: Payer: Self-pay

## 2021-10-19 NOTE — Telephone Encounter (Signed)
Transition Care Management Unsuccessful Follow-up Telephone Call  Date of discharge and from where:  10/18/2021 from [facility]  Attempts:  1st Attempt  Reason for unsuccessful TCM follow-up call:  Unable to leave message

## 2021-10-20 NOTE — Telephone Encounter (Signed)
Transition Care Management Unsuccessful Follow-up Telephone Call  Date of discharge and from where:  10/18/2021 from [facility]  Attempts:  2nd Attempt  Reason for unsuccessful TCM follow-up call:  Unable to leave message

## 2021-10-21 NOTE — Telephone Encounter (Signed)
Transition Care Management Unsuccessful Follow-up Telephone Call  Date of discharge and from where:  10/18/2021 from [facility]  Attempts:  3rd Attempt  Reason for unsuccessful TCM follow-up call:  Unable to reach patient

## 2022-10-12 DIAGNOSIS — Z114 Encounter for screening for human immunodeficiency virus [HIV]: Secondary | ICD-10-CM | POA: Diagnosis not present

## 2022-10-12 DIAGNOSIS — Z202 Contact with and (suspected) exposure to infections with a predominantly sexual mode of transmission: Secondary | ICD-10-CM | POA: Diagnosis not present

## 2022-10-12 DIAGNOSIS — R21 Rash and other nonspecific skin eruption: Secondary | ICD-10-CM | POA: Diagnosis not present

## 2022-10-12 DIAGNOSIS — Z9189 Other specified personal risk factors, not elsewhere classified: Secondary | ICD-10-CM | POA: Diagnosis not present

## 2022-10-12 DIAGNOSIS — Z1159 Encounter for screening for other viral diseases: Secondary | ICD-10-CM | POA: Diagnosis not present

## 2022-10-19 DIAGNOSIS — Z202 Contact with and (suspected) exposure to infections with a predominantly sexual mode of transmission: Secondary | ICD-10-CM | POA: Diagnosis not present

## 2022-10-19 DIAGNOSIS — Z1159 Encounter for screening for other viral diseases: Secondary | ICD-10-CM | POA: Diagnosis not present

## 2022-10-19 DIAGNOSIS — Z114 Encounter for screening for human immunodeficiency virus [HIV]: Secondary | ICD-10-CM | POA: Diagnosis not present

## 2022-11-02 IMAGING — US US PELVIS COMPLETE TRANSABD/TRANSVAG W DUPLEX
2 series · 13 of 25 positions shown · non-contrast
Comparison: Abdomen and pelvis CT, dated May 18, 2021.

CLINICAL DATA: Right-sided pelvic pain.

EXAM:
TRANSABDOMINAL AND TRANSVAGINAL ULTRASOUND OF PELVIS
DOPPLER ULTRASOUND OF OVARIES
TECHNIQUE: Both transabdominal and transvaginal ultrasound examinations of the
pelvis were performed. Transabdominal technique was performed for
global imaging of the pelvis including uterus, ovaries, adnexal
regions, and pelvic cul-de-sac.
It was necessary to proceed with endovaginal exam following the
transabdominal exam to visualize the uterus, endometrium, bilateral
ovaries and bilateral adnexa. Color and duplex Doppler ultrasound
was utilized to evaluate blood flow to the ovaries.

[Series 1: us pelvis complete transabd/transvag w duplex · 5 of 39 slices shown (1 of 2)]
[im 1/39]
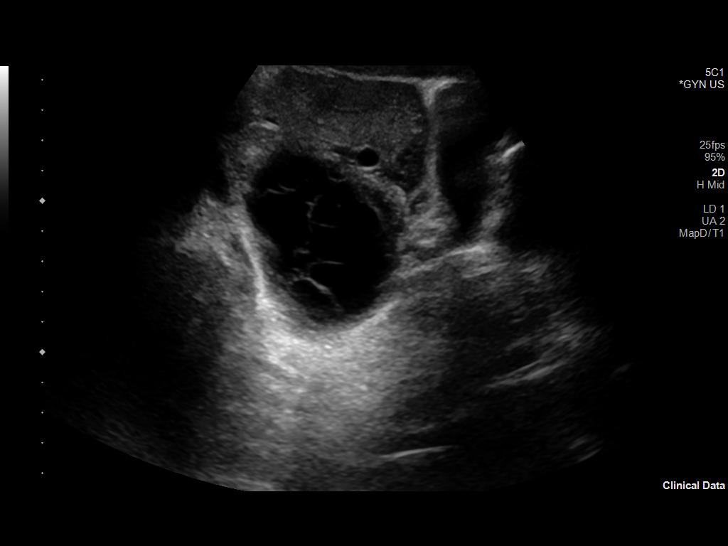
[im 9/39]
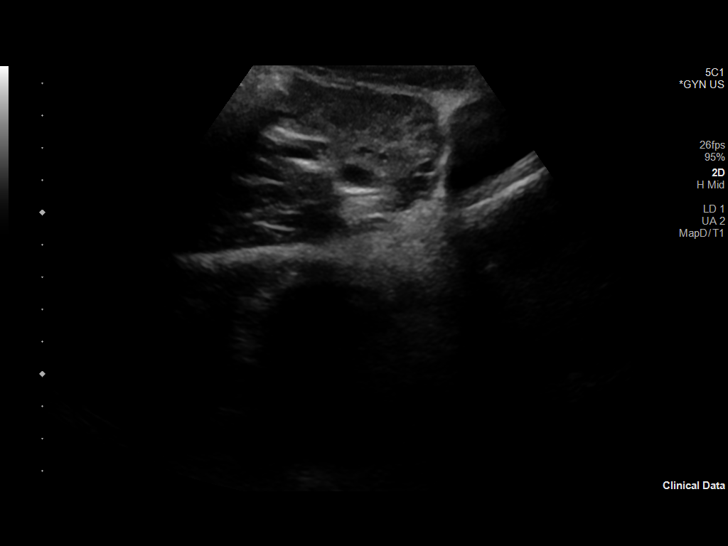
[im 17/39]
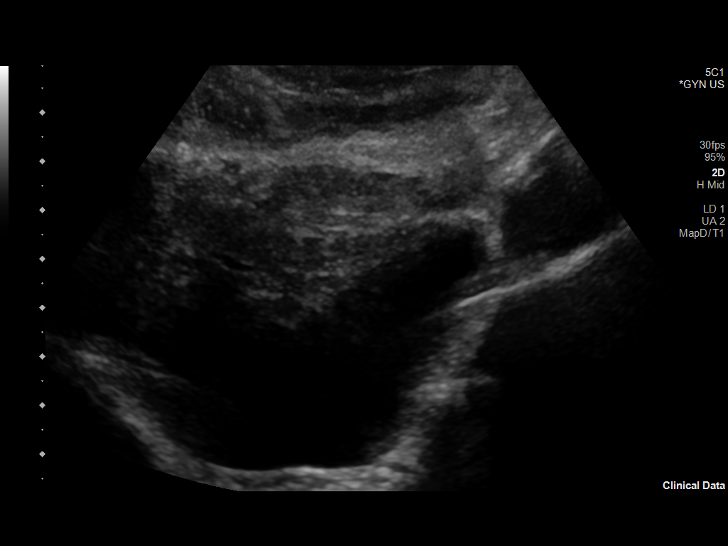
[im 26/39]
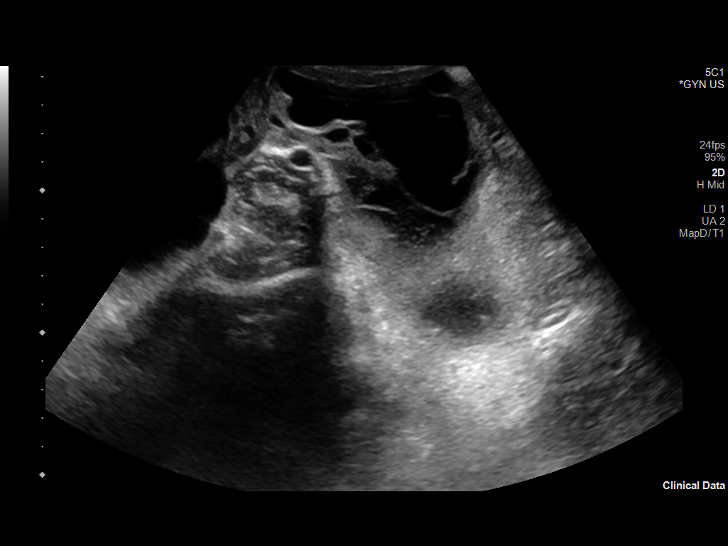
[im 34/39]
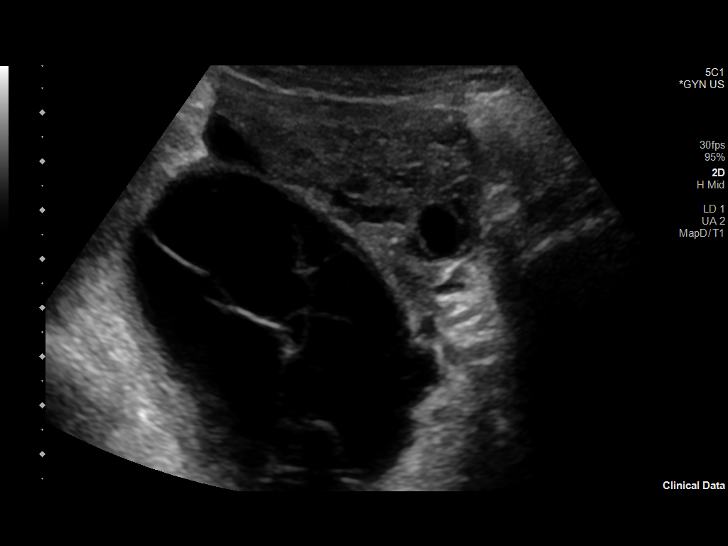

[Series 2: us pelvis complete transabd/transvag w duplex · 8 of 62 slices shown (2 of 2)]
[im 1/62]
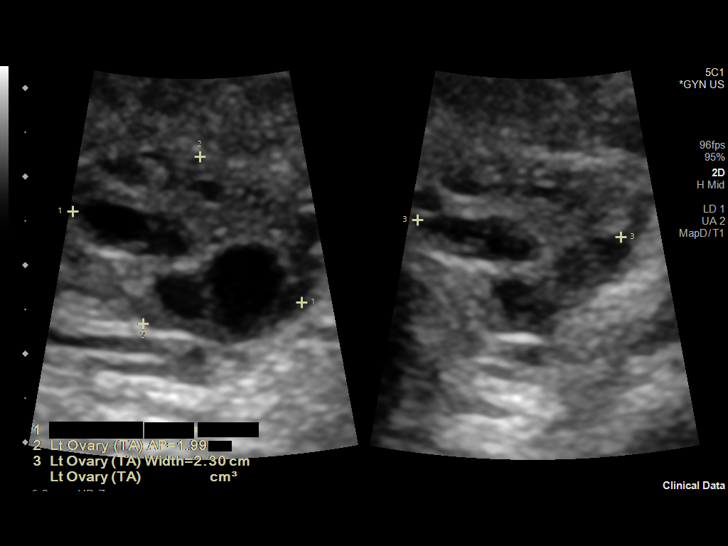
[im 9/62]
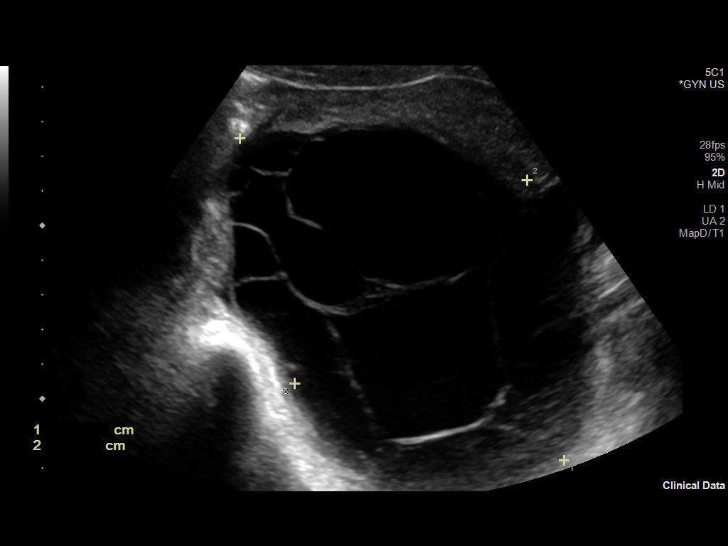
[im 18/62]
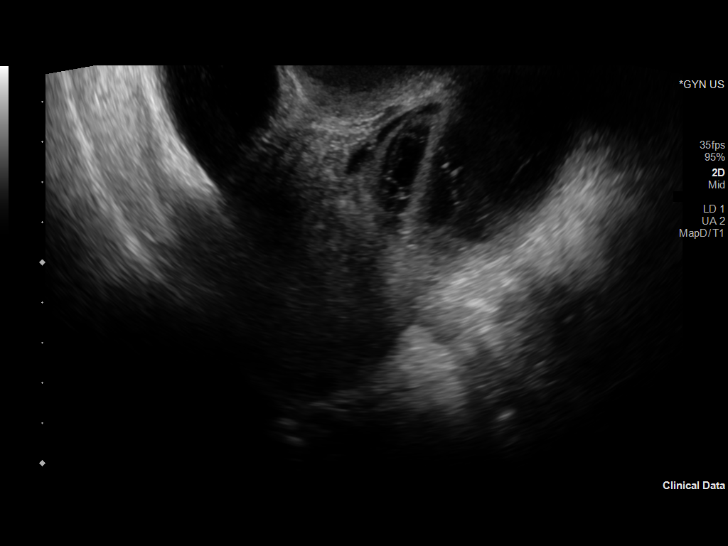
[im 27/62]
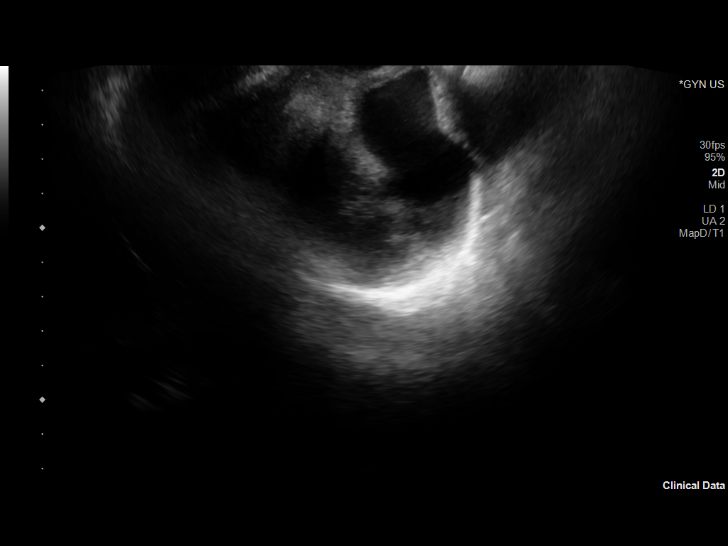
[im 35/62]
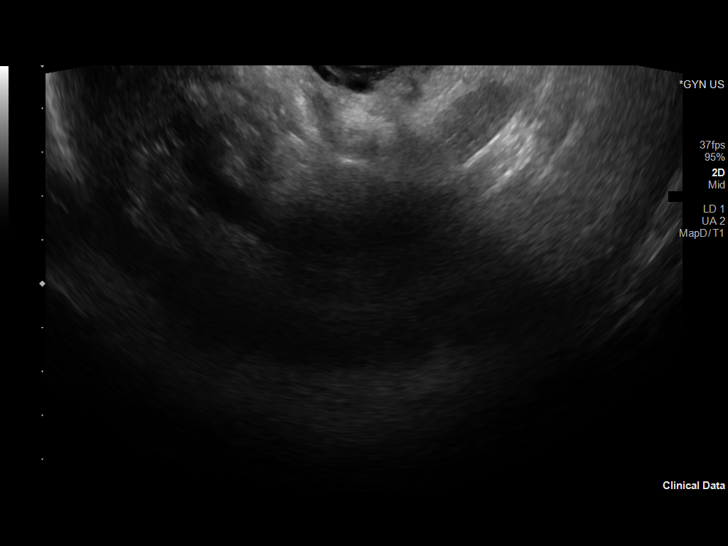
[im 44/62]
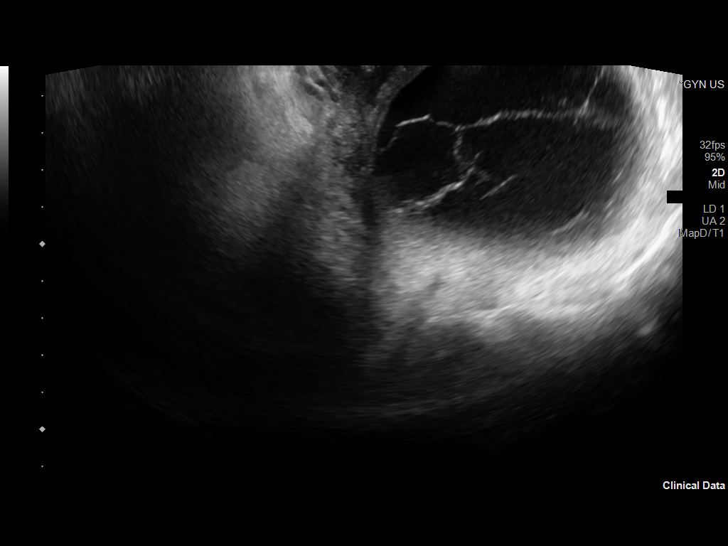
[im 53/62]
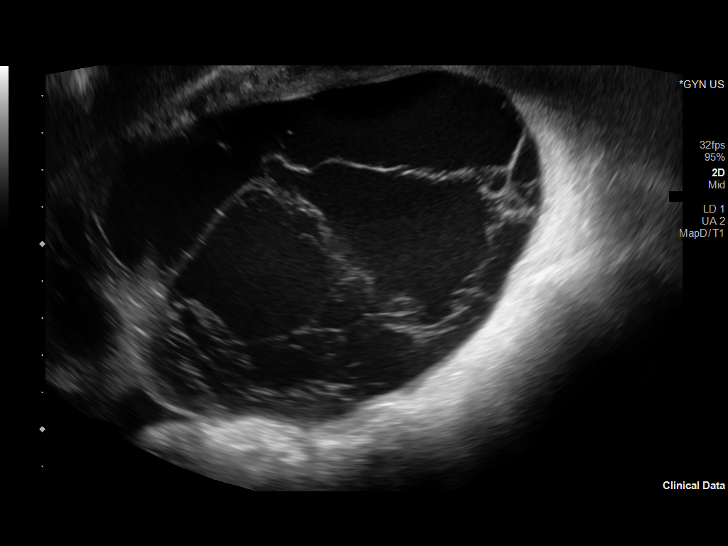
[im 62/62]
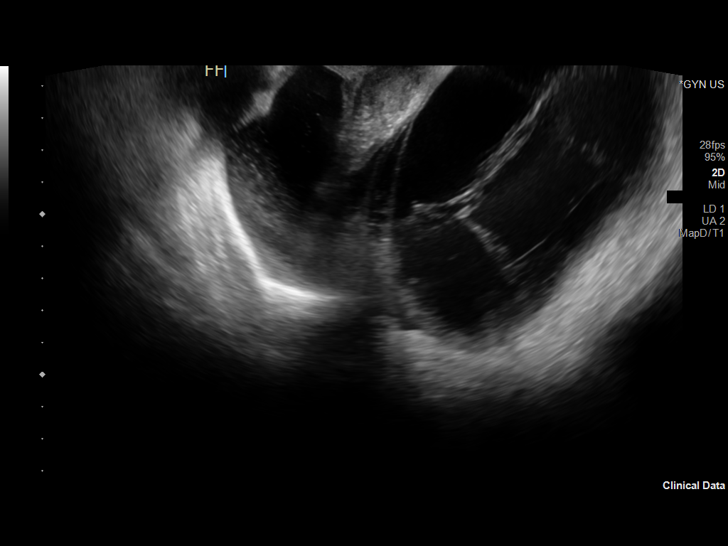

[13 of 25 positions shown; findings below may reference images not displayed]

FINDINGS: Uterus

Measurements: 8.9 cm x 3.5 cm x 4.7 cm = volume: 77 mL. No fibroids
or other mass visualized.

Endometrium

Thickness: N/A.  Poorly visualized.

Right ovary

The right ovary is not visualized. A 15.0 cm x 8.2 cm x 14.0 cm
complex appearing, anechoic structure is seen within the right
adnexa. This contains multiple thin septations. No abnormal flow is
seen within this region on color Doppler evaluation.

Left ovary

Measurements: 2.8 cm x 2.0 cm x 2.3 cm = volume: 6.7 mL. Normal
appearance/no adnexal mass.

Pulsed Doppler evaluation of the LEFT ovary demonstrates normal
low-resistance arterial and venous waveforms.

Other findings

A small amount of pelvic free fluid is seen.
IMPRESSION: 1. Very large, complex right adnexal cyst without visualization of
normal right ovarian parenchyma. Further evaluation with surgical
consultation and MRI is recommended, as an underlying neoplasm
cannot be excluded. This recommendation follows the consensus
statement: Management of Asymptomatic Ovarian and Other Adnexal
Cysts Imaged at US: Society of Radiologists in Ultrasound Consensus

## 2022-11-02 IMAGING — CT CT ABD-PELV W/ CM
2 of 4 series · 14 of 46 positions shown, 16 images · IV contrast (Omnipaque)
Comparison: None

CLINICAL DATA: Right lower quadrant pain, constipation and urinary
frequency

EXAM:
CT ABDOMEN AND PELVIS WITH CONTRAST
TECHNIQUE: Multidetector CT imaging of the abdomen and pelvis was performed
using the standard protocol following bolus administration of
intravenous contrast.
CONTRAST:  75mL OMNIPAQUE IOHEXOL 300 MG/ML  SOLN

[Series 2: axial st · axial · 0.77mm/px · z∈[-642,-298]mm · 11 of 83 slices shown, 13 images]
[im 7/83  soft-tissue]
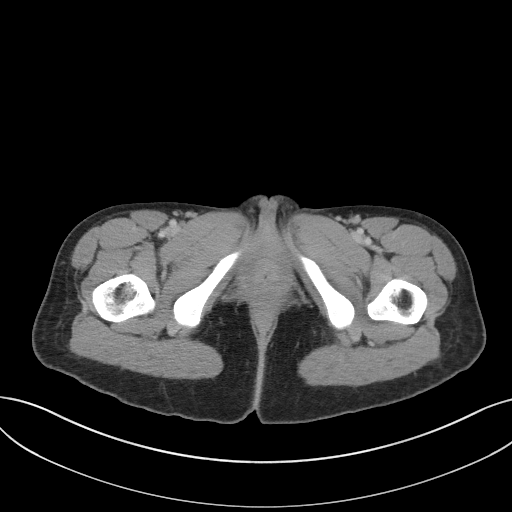
[im 7/83  bone]
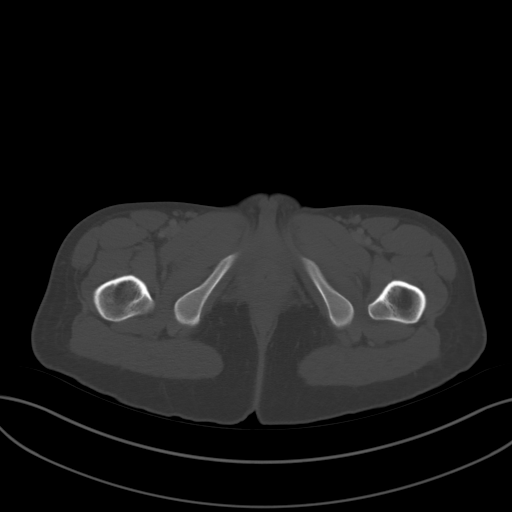
[im 13/83  soft-tissue]
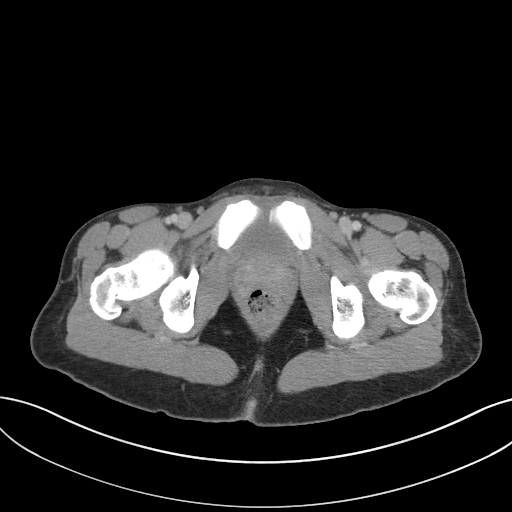
[im 19/83  soft-tissue]
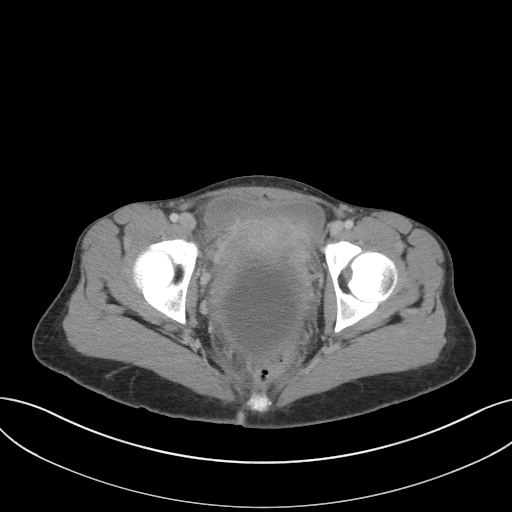
[im 26/83  soft-tissue]
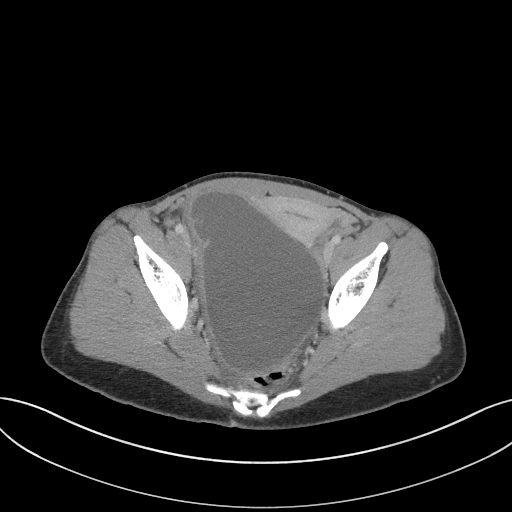
[im 35/83  soft-tissue]
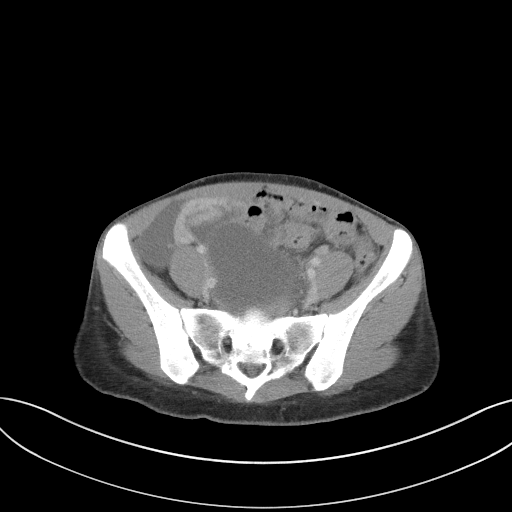
[im 42/83  soft-tissue]
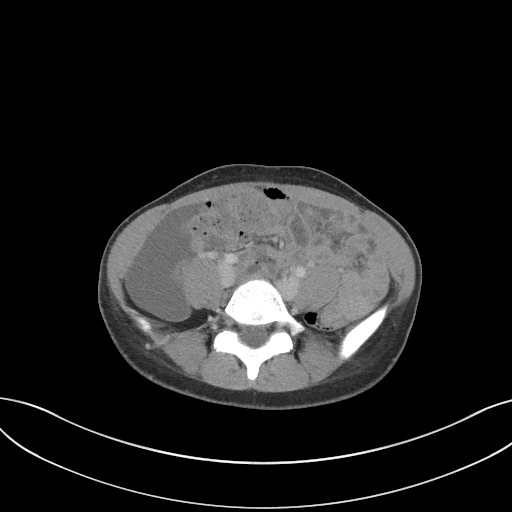
[im 48/83  soft-tissue]
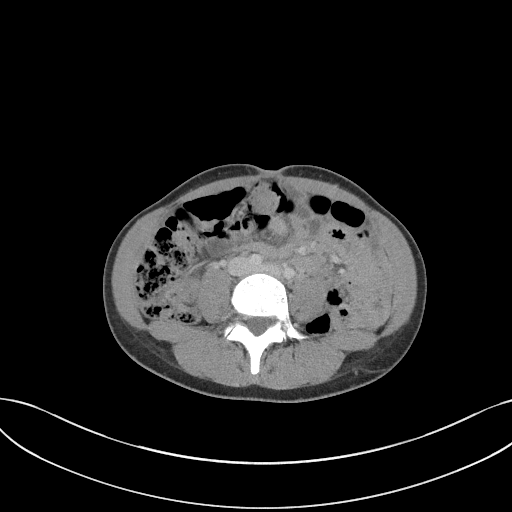
[im 57/83  soft-tissue]
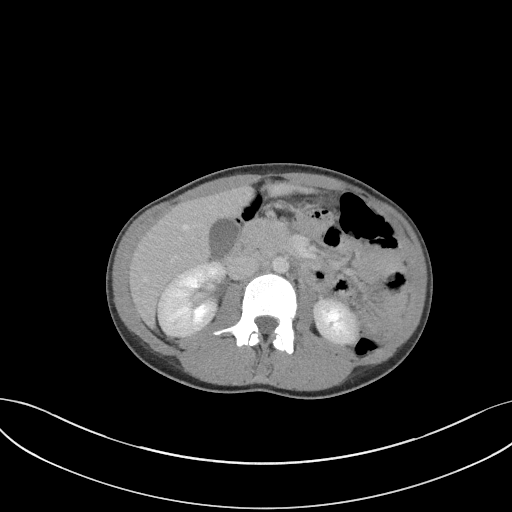
[im 64/83  soft-tissue]
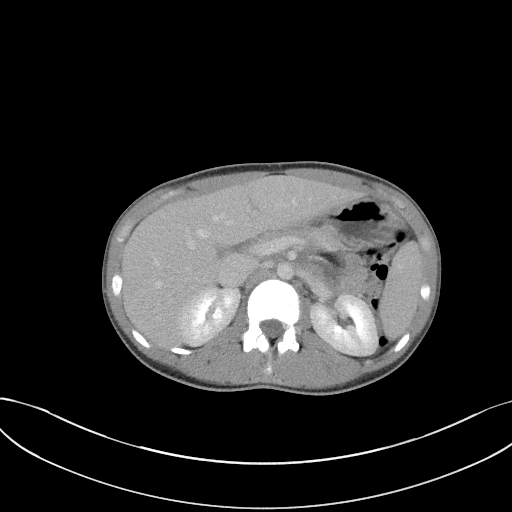
[im 64/83  bone]
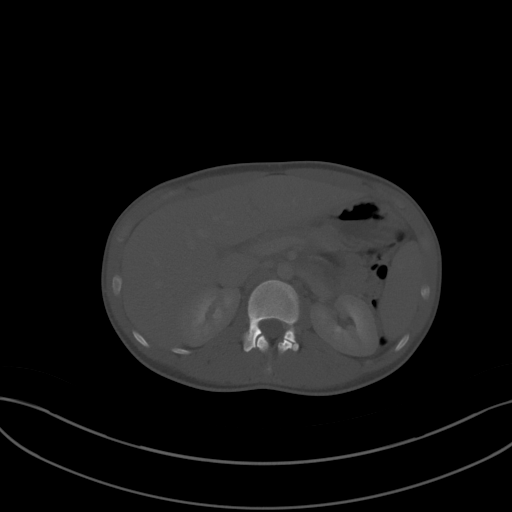
[im 70/83  soft-tissue]
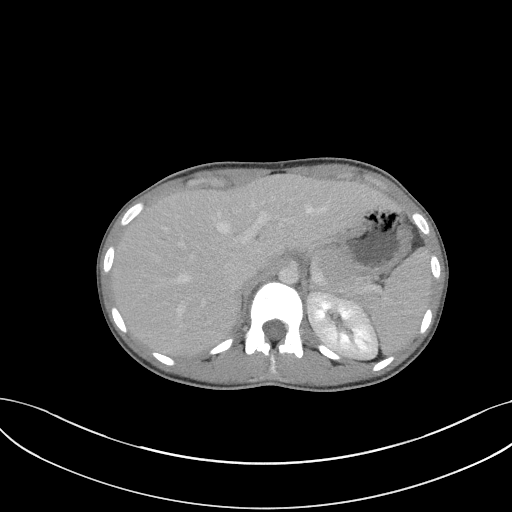
[im 76/83  soft-tissue]
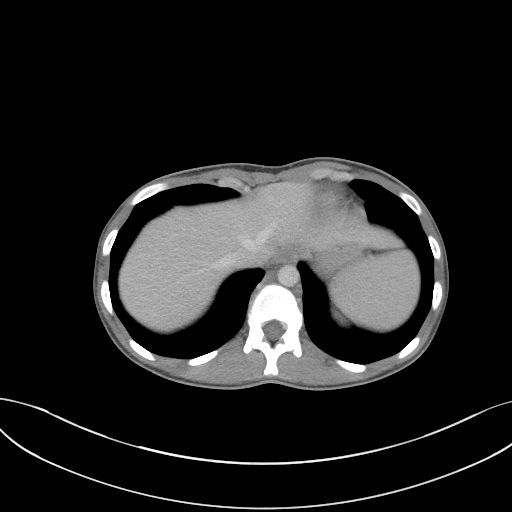

[Series 5: coronal st · coronal · 0.58mm/px · 3 of 70 slices shown]
[im 24/70  soft-tissue]
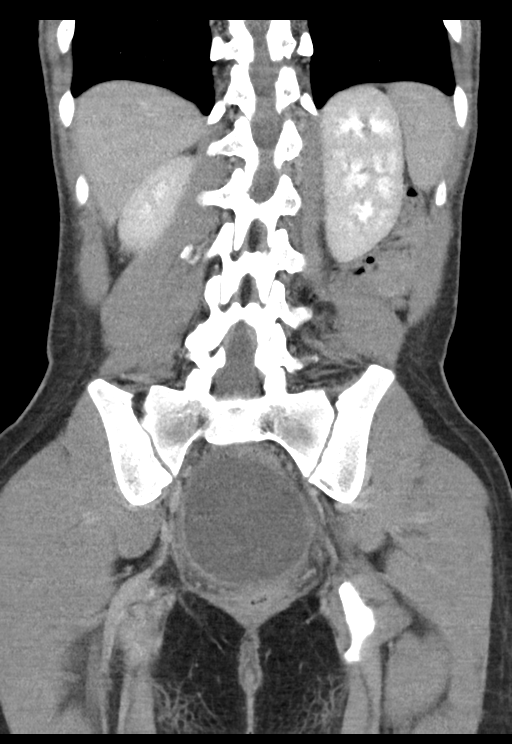
[im 31/70  soft-tissue]
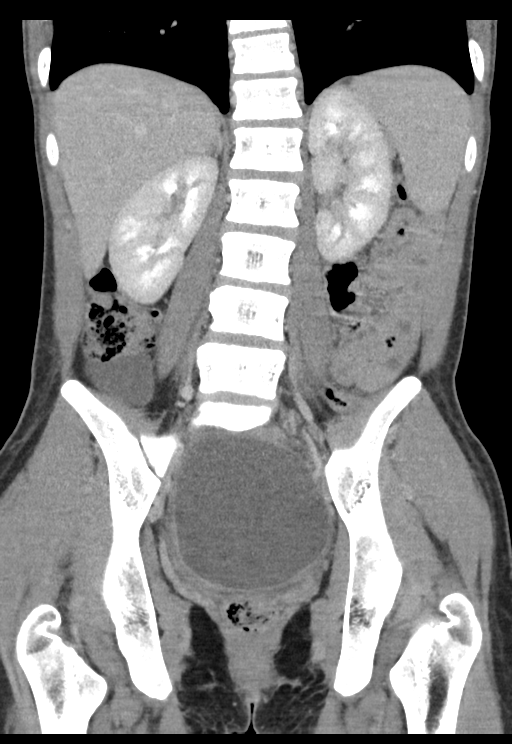
[im 39/70  soft-tissue]
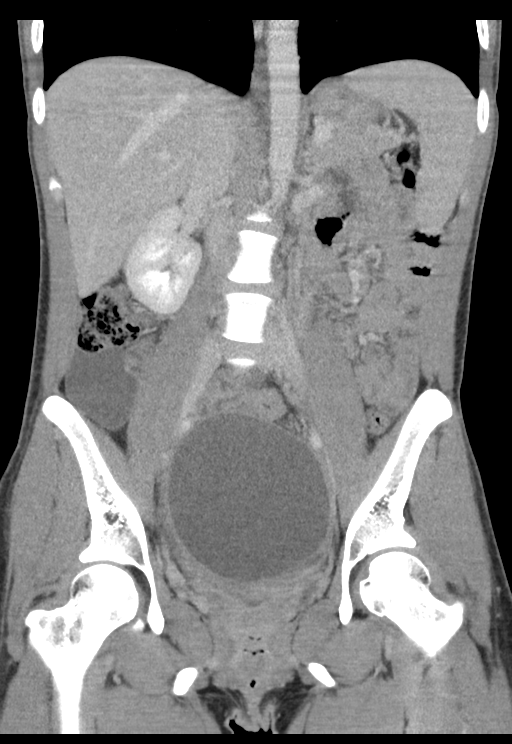

[14 of 46 positions shown; findings below may reference images not displayed]

FINDINGS: Lower chest: No acute abnormality in the upper chest or imaged lung
apices.

Hepatobiliary: No worrisome focal liver lesions. Smooth liver
surface contour. Normal hepatic attenuation. Normal gallbladder and
biliary tree.

Pancreas: No pancreatic ductal dilatation or surrounding
inflammatory changes.

Spleen: Normal in size. No concerning splenic lesions.

Adrenals/Urinary Tract: Normal adrenal glands. Kidneys are normally
located with symmetric enhancement. No suspicious renal lesion,
urolithiasis or hydronephrosis. Urinary bladder is largely
decompressed at the time of exam and therefore poorly evaluated by
CT imaging. Mild bladder wall thickening likely related to
underdistention.

Stomach/Bowel: Challenging assessment of the bowel and mesentery
given a paucity of mesenteric fat and mass effect from the large
fluid structure in the pelvis. Distal esophagus, stomach and
duodenal sweep are unremarkable. No small bowel wall thickening or
dilatation. No evidence of obstruction. Normal appearing air-filled
appendix seen coiling in retrocecal position from the cecal tip,
along the inferior margin of the right kidney. No focal appendiceal
inflammation to suggest acute appendicitis. No colonic dilatation or
wall thickening. Compression of the sigmoid colon by the large
pelvic structure. No high-grade bowel obstruction is seen.

Vascular/Lymphatic: No significant vascular findings are present. No
visible enlarged abdominal or pelvic lymph nodes with evaluation
complicated by the absence of intraperitoneal fat.

Reproductive: Large fluid attenuation structure in the pelvis which
appears to extend from the right adnexa measuring approximately
x 10.9 x 9.5 cm in maximum dimensions with slightly elongated
projection anteriorly which could reflect a tubular origin such as a
large hydrosalpinx however this is incompletely characterized on
this exam. Furthermore, there is surrounding thickening and
edematous changes as well as prominence of the right parametrial
vasculature including several vessels which demonstrates some
questionable thickening particular coronal reconstructions (46-55).
Uterus is displaced left laterally. No concerning left adnexal
lesion.

Other: Small to moderate volume low-attenuation free fluid in the
deep pelvis and right adnexa. Inflammatory changes adjacent the
large fluid attenuation structure in the pelvis as well.
Displacement of the pelvic viscera. No bowel containing hernia.

Musculoskeletal: No acute osseous abnormality or suspicious osseous
lesion.
IMPRESSION: Large fluid attenuation structure which appears to arise from the
right adnexa demonstrates some questionable tubular appearance
anteriorly, could reflect a large hydrosalpinx though other cystic
adnexal masses/cystic ovarian neoplasm is not fully excluded. Is
incompletely characterized on this exam. Furthermore, there is some
notable prominence of the right parametrial vasculature and
questionable twisting of the vessels in the right adnexa. Torsion is
not fully excluded particularly given the setting of acute onset
pelvic pain. Consider further interrogation with pelvic ultrasound
with Doppler though ultimately the structure itself may require more
complete characterization with contrast enhanced MR imaging.

Displacement of the pelvic viscera and some compression of the
distal sigmoid colon albeit without high-grade obstruction.

Normal appendix.

These results were called by telephone at the time of interpretation
on 05/18/2021 at [DATE] to provider VIRGINIA RM , who
verbally acknowledged these results.

## 2022-11-16 DIAGNOSIS — Z202 Contact with and (suspected) exposure to infections with a predominantly sexual mode of transmission: Secondary | ICD-10-CM | POA: Diagnosis not present

## 2023-04-24 DIAGNOSIS — Z111 Encounter for screening for respiratory tuberculosis: Secondary | ICD-10-CM | POA: Diagnosis not present

## 2023-06-15 DIAGNOSIS — N898 Other specified noninflammatory disorders of vagina: Secondary | ICD-10-CM | POA: Diagnosis not present

## 2023-10-21 DIAGNOSIS — J45901 Unspecified asthma with (acute) exacerbation: Secondary | ICD-10-CM | POA: Diagnosis not present

## 2023-10-21 DIAGNOSIS — I498 Other specified cardiac arrhythmias: Secondary | ICD-10-CM | POA: Diagnosis not present

## 2023-10-21 DIAGNOSIS — E876 Hypokalemia: Secondary | ICD-10-CM | POA: Diagnosis not present

## 2023-10-21 DIAGNOSIS — J45909 Unspecified asthma, uncomplicated: Secondary | ICD-10-CM | POA: Diagnosis not present

## 2023-10-21 DIAGNOSIS — R739 Hyperglycemia, unspecified: Secondary | ICD-10-CM | POA: Diagnosis not present

## 2023-10-21 DIAGNOSIS — R059 Cough, unspecified: Secondary | ICD-10-CM | POA: Diagnosis not present

## 2023-10-21 DIAGNOSIS — Z20822 Contact with and (suspected) exposure to covid-19: Secondary | ICD-10-CM | POA: Diagnosis not present

## 2023-10-22 DIAGNOSIS — I498 Other specified cardiac arrhythmias: Secondary | ICD-10-CM | POA: Diagnosis not present

## 2023-11-21 DIAGNOSIS — J45909 Unspecified asthma, uncomplicated: Secondary | ICD-10-CM | POA: Diagnosis not present

## 2023-11-21 DIAGNOSIS — Z753 Unavailability and inaccessibility of health-care facilities: Secondary | ICD-10-CM | POA: Diagnosis not present

## 2023-11-21 DIAGNOSIS — J101 Influenza due to other identified influenza virus with other respiratory manifestations: Secondary | ICD-10-CM | POA: Diagnosis not present

## 2023-11-21 DIAGNOSIS — R0602 Shortness of breath: Secondary | ICD-10-CM | POA: Diagnosis not present

## 2023-11-21 DIAGNOSIS — Z20822 Contact with and (suspected) exposure to covid-19: Secondary | ICD-10-CM | POA: Diagnosis not present

## 2024-02-18 DIAGNOSIS — Z111 Encounter for screening for respiratory tuberculosis: Secondary | ICD-10-CM | POA: Diagnosis not present

## 2024-02-19 DIAGNOSIS — Z043 Encounter for examination and observation following other accident: Secondary | ICD-10-CM | POA: Diagnosis not present

## 2024-02-19 DIAGNOSIS — S93402A Sprain of unspecified ligament of left ankle, initial encounter: Secondary | ICD-10-CM | POA: Diagnosis not present

## 2024-02-20 DIAGNOSIS — Z043 Encounter for examination and observation following other accident: Secondary | ICD-10-CM | POA: Diagnosis not present

## 2024-02-20 DIAGNOSIS — S93402A Sprain of unspecified ligament of left ankle, initial encounter: Secondary | ICD-10-CM | POA: Diagnosis not present

## 2024-05-05 DIAGNOSIS — N939 Abnormal uterine and vaginal bleeding, unspecified: Secondary | ICD-10-CM | POA: Diagnosis not present

## 2024-05-09 ENCOUNTER — Emergency Department (HOSPITAL_BASED_OUTPATIENT_CLINIC_OR_DEPARTMENT_OTHER)
Admission: EM | Admit: 2024-05-09 | Discharge: 2024-05-09 | Disposition: A | Discharge status: Home / Self Care | Attending: Emergency Medicine | Admitting: Emergency Medicine

## 2024-05-09 ENCOUNTER — Other Ambulatory Visit: Payer: Self-pay

## 2024-05-09 ENCOUNTER — Encounter (HOSPITAL_BASED_OUTPATIENT_CLINIC_OR_DEPARTMENT_OTHER): Payer: Self-pay

## 2024-05-09 DIAGNOSIS — N939 Abnormal uterine and vaginal bleeding, unspecified: Secondary | ICD-10-CM | POA: Diagnosis not present

## 2024-05-09 DIAGNOSIS — Z7951 Long term (current) use of inhaled steroids: Secondary | ICD-10-CM | POA: Insufficient documentation

## 2024-05-09 DIAGNOSIS — J45909 Unspecified asthma, uncomplicated: Secondary | ICD-10-CM | POA: Diagnosis not present

## 2024-05-09 DIAGNOSIS — Z87891 Personal history of nicotine dependence: Secondary | ICD-10-CM | POA: Diagnosis not present

## 2024-05-09 LAB — CBC
HCT: 38.6 % (ref 36.0–46.0)
Hemoglobin: 12.6 g/dL (ref 12.0–15.0)
MCH: 28.1 pg (ref 26.0–34.0)
MCHC: 32.6 g/dL (ref 30.0–36.0)
MCV: 86 fL (ref 80.0–100.0)
Platelets: 269 K/uL (ref 150–400)
RBC: 4.49 MIL/uL (ref 3.87–5.11)
RDW: 13.4 % (ref 11.5–15.5)
WBC: 5.1 K/uL (ref 4.0–10.5)
nRBC: 0 % (ref 0.0–0.2)

## 2024-05-09 LAB — PREGNANCY, URINE: Preg Test, Ur: NEGATIVE

## 2024-05-09 NOTE — ED Triage Notes (Signed)
 Pt presents with intermittent vaginal bleeding over the past 3 weeks with associated pelvic cramping and nausea. Denies fevers or chills. She was seen at [facility] ED, but she reports they didn't do any tests.

## 2024-05-09 NOTE — ED Provider Notes (Signed)
 [place] EMERGENCY DEPARTMENT AT [facility] Provider Note   CSN: [id number] Arrival date & time: 05/09/24  1605     Patient presents with: Vaginal Bleeding   Dawn Hudson is a 23 y.o. female.    Vaginal Bleeding   23 year old female presents emergency department complaints of vaginal spotting.  States that she ended her last cycle with the 1st or 2nd of this month.  For the last 9 days, has had intermittent vaginal spotting as well as cramping lower abdominal pain.  States that she did have spotting when she urinated earlier today but it seems to have stopped.  Currently without any pain.  Denies any fevers, chills, nausea, vomiting, urinary symptoms  Past medical history significant for asthma, DMDD, ODD, SI  Prior to Admission medications   Medication Sig Start Date End Date Taking? Authorizing Provider  albuterol  (PROVENTIL  HFA;VENTOLIN  HFA) 108 (90 Base) MCG/ACT inhaler Inhale 1-2 puffs into the lungs every 6 (six) hours as needed for wheezing or shortness of breath. 04/16/16   Palumbo, April, MD  cetirizine (ZYRTEC) 10 MG tablet TK 1 T PO D 08/27/18   [provider]  fluticasone (FLONASE) 50 MCG/ACT nasal spray INHALE 1 PUFFS IN EACH NOSTRIL BID 07/08/18   [provider]  ibuprofen  (ADVIL ) 600 MG tablet Take 1 tablet (600 mg total) by mouth every 6 (six) hours as needed for moderate pain. 06/10/19   Rogers, Veronica C, CNM  SYMBICORT 80-4.5 MCG/ACT inhaler INHALE 2 PUFFS PO BID IN THE MORNING AND EVENING 07/08/18   [provider]    Allergies: Patient has no known allergies.    Review of Systems  Genitourinary:  Positive for vaginal bleeding.  All other systems reviewed and are negative.   Updated Vital Signs BP 110/87 (BP Location: Left Arm)   Pulse 65   Temp 98.3 F (36.8 C) (Tympanic)   Resp 18   Ht 5' 3 (1.6 m)   Wt 59.9 kg   LMP 04/18/2024   SpO2 100%   BMI 23.38 kg/m   Physical Exam Vitals and nursing note reviewed.   Constitutional:      General: She is not in acute distress.    Appearance: She is well-developed.  HENT:     Head: Normocephalic and atraumatic.  Eyes:     Conjunctiva/sclera: Conjunctivae normal.  Cardiovascular:     Rate and Rhythm: Normal rate and regular rhythm.     Heart sounds: No murmur heard. Pulmonary:     Effort: Pulmonary effort is normal. No respiratory distress.     Breath sounds: Normal breath sounds. No wheezing, rhonchi or rales.  Abdominal:     Palpations: Abdomen is soft.     Tenderness: There is no abdominal tenderness. There is no guarding.  Musculoskeletal:        General: No swelling.     Cervical back: Neck supple.  Skin:    General: Skin is warm and dry.     Capillary Refill: Capillary refill takes less than 2 seconds.  Neurological:     Mental Status: She is alert.  Psychiatric:        Mood and Affect: Mood normal.     (all labs ordered are listed, but only abnormal results are displayed) Labs Reviewed  PREGNANCY, URINE  CBC    EKG: None  Radiology: No results found.   Procedures   Medications Ordered in the ED - No data to display  Medical Decision Making Amount and/or Complexity of Data Reviewed Labs: ordered. Radiology: ordered.   This patient presents to the ED for concern of vaginal spotting, this involves an extensive number of treatment options, and is a complaint that carries with it a high risk of complications and morbidity.  The differential diagnosis includes miscarriage, leiomyoma, leiomyoma, ovarian cyst, malignancy, ovarian dysfunction, coagulopathy, other   Co morbidities that complicate the patient evaluation  See HPI   Additional history obtained:  Additional history obtained from EMR External records from outside source obtained and reviewed including hospital records   Lab Tests:  I Ordered, and personally interpreted labs.  The pertinent results include: No  leukocytosis.  No evidence of anemia.  Platelets within range.  Urine pregnancy negative.   Imaging Studies ordered:  N/a   Cardiac Monitoring: / EKG:  The patient was maintained on a cardiac monitor.  I personally viewed and interpreted the cardiac monitored which showed an underlying rhythm of: Sinus rhythm   Consultations Obtained:  N/a   Problem List / ED Course / Critical interventions / Medication management  Vaginal spotting Reevaluation of the patient showed that the patient stayed the same I have reviewed the patients home medicines and have made adjustments as needed   Social Determinants of Health:  Former cigar use.  Denies illicit drug use.   Test / Admission - Considered:  Vaginal spotting Vitals signs within normal range and stable throughout visit. Laboratory studies significant for: See above 23 year old female presents emergency department complaints of vaginal spotting.  States that she ended her last cycle with the 1st or 2nd of this month.  For the last 9 days, has had intermittent vaginal spotting as well as cramping lower abdominal pain.  States that she did have spotting when she urinated earlier today but it seems to have stopped.  Currently without any pain.  Denies any fevers, chills, nausea, vomiting, urinary symptoms On exam, no reproducible abdominal tenderness.  Patient's vaginal spotting very intermittent.  Offered pelvic ultrasound this was deferred.  Workup today reassuring.  Urine pregnancy negative.  Hemoglobin normal.  Given the patient's symptoms are not persistent, transfer emergent ultrasound given that ultrasound not available currently at this facility was deemed unnecessary.  Patient interested in having ultrasound done tomorrow in the outpatient setting.  Will recommend close follow-up with OB/GYN in the outpatient setting.  Offered medication in the form of NSAIDs, OCP, TXA which she would prefer to wait until she talks with her OB/GYN  after the ultrasound results were back.  Will order ultrasound to be performed tomorrow.  Treatment plan discussed with patient and she denies understanding was agreeable to said plan.  Patient will well-appearing, afebrile in no acute distress. Worrisome signs and symptoms were discussed with the patient, and the patient acknowledged understanding to return to the ED if noticed. Patient was stable upon discharge.       Final diagnoses:  Vaginal spotting    ED Discharge Orders          Ordered    US  PELVIC COMPLETE W TRANSVAGINAL AND TORSION R/O        05/09/24 1826               Silver Wonda LABOR, PA 05/09/24 1843    Elnor Jayson LABOR, DO 05/15/24 [id number]

## 2024-05-09 NOTE — Discharge Instructions (Signed)
 As discussed, we will schedule an ultrasound for tomorrow of your pelvis.  Your red blood cell count was normal and your pregnancy test was negative.  Would recommend follow-up with your OB/GYN afterwards for reassessment.  Please do not hesitate to return to the emergency department if the worrisome signs and symptoms we discussed become apparent.

## 2024-05-09 NOTE — ED Notes (Signed)
 Pt. Had all blood work and urine done in triage.. Pt. Reports to RN she is here due to having spotting after her normal Period.  Pt. Will be discharged after her assessment by [facility]

## 2024-05-20 ENCOUNTER — Ambulatory Visit (HOSPITAL_BASED_OUTPATIENT_CLINIC_OR_DEPARTMENT_OTHER)
Admission: RE | Admit: 2024-05-20 | Discharge: 2024-05-20 | Disposition: A | Discharge status: Home / Self Care | Source: Ambulatory Visit | Attending: Emergency Medicine | Admitting: Emergency Medicine

## 2024-05-20 DIAGNOSIS — N838 Other noninflammatory disorders of ovary, fallopian tube and broad ligament: Secondary | ICD-10-CM | POA: Diagnosis not present

## 2024-05-20 DIAGNOSIS — N83202 Unspecified ovarian cyst, left side: Secondary | ICD-10-CM | POA: Diagnosis not present

## 2024-05-20 DIAGNOSIS — N939 Abnormal uterine and vaginal bleeding, unspecified: Secondary | ICD-10-CM | POA: Diagnosis not present

## 2024-05-20 DIAGNOSIS — N854 Malposition of uterus: Secondary | ICD-10-CM | POA: Diagnosis not present

## 2024-05-20 NOTE — ED Provider Notes (Signed)
 Patient presented today for previously planned pelvic UA including transabdominal and transvaginal views.  Per radiology interpretation:  IMPRESSION: 1. Normal ultrasound appearance of the uterus. 2. Normal endometrial stripe thickness and appearance. If bleeding remains unresponsive to hormonal or medical therapy, sonohysterogram should be considered for focal lesion work-up. (Ref: Radiological Reasoning: Algorithmic Workup of Abnormal Vaginal Bleeding with Endovaginal Sonography and Sonohysterography. AJR 2008; 808:D31-26) 3. Normal ultrasound appearance of the right ovary. Previous large right adnexal cyst has resolved or been resected in the interval. 4. 2.7 cm complex cystic lesion in the left ovary most likely representing hemorrhagic cyst although features are indeterminate. Follow-up examination in 6-12 weeks is recommended. Due to thickened septations, consider gynecological referral.  Will refer to OB/GYN. Patient reports no further bleeding - doing well.    Dawn Balls, PA-C 05/20/24 1726    [name], MD 05/20/24 [phone]

## 2024-07-09 ENCOUNTER — Telehealth: Payer: Self-pay

## 2024-07-09 DIAGNOSIS — J45901 Unspecified asthma with (acute) exacerbation: Secondary | ICD-10-CM

## 2024-07-09 NOTE — Progress Notes (Signed)
 Complex Care Management Note Care Guide Note  07/09/2024 Name: Dawn Hudson MRN: [id number] DOB: 2001-06-26   Complex Care Management Outreach Attempts: An unsuccessful telephone outreach was attempted today to offer the patient information about available complex care management services.  Follow Up Plan:  Additional outreach attempts will be made to offer the patient complex care management information and services.   Encounter Outcome:  No Answer  Dreama [facility]  [facility], [facility] VBCI Assistant Direct Dial: [phone]  Fax: [phone]

## 2024-07-11 NOTE — Progress Notes (Signed)
 Complex Care Management Note Care Guide Note  07/11/2024 Name: Dawn Hudson MRN: [id number] DOB: May 02, 2001   Complex Care Management Outreach Attempts: A second unsuccessful outreach was attempted today to offer the patient with information about available complex care management services.  Follow Up Plan:  Additional outreach attempts will be made to offer the patient complex care management information and services.   Encounter Outcome:  No Answer  Dreama [facility]  [facility], [address] [facility] VBCI Assistant Direct Dial: [phone]  Fax: [phone]

## 2024-07-14 NOTE — Progress Notes (Signed)
 Complex Care Management Note Care Guide Note  07/14/2024 Name: Dawn Hudson MRN: [id number] DOB: [birth year]   Complex Care Management Outreach Attempts: A third unsuccessful outreach was attempted today to offer the patient with information about available complex care management services.  Follow Up Plan:  No further outreach attempts will be made at this time. We have been unable to contact the patient to offer or enroll patient in complex care management services.  Encounter Outcome:  No Answer  Dreama [facility]  [facility], [facility] VBCI Assistant Direct Dial: [phone]  Fax: [phone]

## 2024-07-23 HISTORY — PX: HAND SURGERY: SHX662

## 2024-07-25 DIAGNOSIS — M6789 Other specified disorders of synovium and tendon, multiple sites: Secondary | ICD-10-CM | POA: Diagnosis not present

## 2024-07-25 DIAGNOSIS — S62320B Displaced fracture of shaft of second metacarpal bone, right hand, initial encounter for open fracture: Secondary | ICD-10-CM | POA: Diagnosis not present

## 2024-07-25 DIAGNOSIS — Z23 Encounter for immunization: Secondary | ICD-10-CM | POA: Diagnosis not present

## 2024-07-25 DIAGNOSIS — S61411A Laceration without foreign body of right hand, initial encounter: Secondary | ICD-10-CM | POA: Diagnosis not present

## 2024-07-25 DIAGNOSIS — S62320A Displaced fracture of shaft of second metacarpal bone, right hand, initial encounter for closed fracture: Secondary | ICD-10-CM | POA: Diagnosis not present

## 2024-07-25 DIAGNOSIS — S62330B Displaced fracture of neck of second metacarpal bone, right hand, initial encounter for open fracture: Secondary | ICD-10-CM | POA: Diagnosis not present

## 2024-07-26 DIAGNOSIS — S62320B Displaced fracture of shaft of second metacarpal bone, right hand, initial encounter for open fracture: Secondary | ICD-10-CM | POA: Diagnosis not present

## 2024-07-26 DIAGNOSIS — M6789 Other specified disorders of synovium and tendon, multiple sites: Secondary | ICD-10-CM | POA: Diagnosis not present

## 2024-07-26 DIAGNOSIS — S62330B Displaced fracture of neck of second metacarpal bone, right hand, initial encounter for open fracture: Secondary | ICD-10-CM | POA: Diagnosis not present

## 2024-07-26 DIAGNOSIS — S61411A Laceration without foreign body of right hand, initial encounter: Secondary | ICD-10-CM | POA: Diagnosis not present

## 2024-08-06 DIAGNOSIS — Z139 Encounter for screening, unspecified: Secondary | ICD-10-CM | POA: Diagnosis not present

## 2024-08-12 DIAGNOSIS — S66108A Unspecified injury of flexor muscle, fascia and tendon of other finger at wrist and hand level, initial encounter: Secondary | ICD-10-CM | POA: Diagnosis not present

## 2024-08-12 DIAGNOSIS — S61431D Puncture wound without foreign body of right hand, subsequent encounter: Secondary | ICD-10-CM | POA: Diagnosis not present

## 2024-08-12 DIAGNOSIS — S62308B Unspecified fracture of other metacarpal bone, initial encounter for open fracture: Secondary | ICD-10-CM | POA: Diagnosis not present

## 2024-08-12 DIAGNOSIS — S61200A Unspecified open wound of right index finger without damage to nail, initial encounter: Secondary | ICD-10-CM | POA: Diagnosis not present

## 2024-08-12 DIAGNOSIS — S64490A Injury of digital nerve of right index finger, initial encounter: Secondary | ICD-10-CM | POA: Diagnosis not present

## 2024-08-25 DIAGNOSIS — S62328D Displaced fracture of shaft of other metacarpal bone, subsequent encounter for fracture with routine healing: Secondary | ICD-10-CM | POA: Diagnosis not present

## 2024-08-25 DIAGNOSIS — Z09 Encounter for follow-up examination after completed treatment for conditions other than malignant neoplasm: Secondary | ICD-10-CM | POA: Diagnosis not present

## 2024-08-25 DIAGNOSIS — S62320D Displaced fracture of shaft of second metacarpal bone, right hand, subsequent encounter for fracture with routine healing: Secondary | ICD-10-CM | POA: Diagnosis not present

## 2024-09-09 DIAGNOSIS — M25631 Stiffness of right wrist, not elsewhere classified: Secondary | ICD-10-CM | POA: Diagnosis not present

## 2024-09-09 DIAGNOSIS — M25641 Stiffness of right hand, not elsewhere classified: Secondary | ICD-10-CM | POA: Diagnosis not present

## 2024-09-09 DIAGNOSIS — M79641 Pain in right hand: Secondary | ICD-10-CM | POA: Diagnosis not present

## 2024-09-25 DIAGNOSIS — S61431S Puncture wound without foreign body of right hand, sequela: Secondary | ICD-10-CM | POA: Diagnosis not present

## 2024-09-25 DIAGNOSIS — S61431D Puncture wound without foreign body of right hand, subsequent encounter: Secondary | ICD-10-CM | POA: Diagnosis not present

## 2024-10-30 ENCOUNTER — Other Ambulatory Visit (HOSPITAL_COMMUNITY)
Admission: RE | Admit: 2024-10-30 | Discharge: 2024-10-30 | Disposition: A | Source: Ambulatory Visit | Attending: Family Medicine | Admitting: Family Medicine

## 2024-10-30 ENCOUNTER — Ambulatory Visit

## 2024-10-30 ENCOUNTER — Other Ambulatory Visit: Payer: Self-pay

## 2024-10-30 VITALS — BP 119/75 | HR 80 | Wt 134.0 lb

## 2024-10-30 DIAGNOSIS — Z3A08 8 weeks gestation of pregnancy: Secondary | ICD-10-CM | POA: Diagnosis present

## 2024-10-30 DIAGNOSIS — Z3401 Encounter for supervision of normal first pregnancy, first trimester: Secondary | ICD-10-CM | POA: Insufficient documentation

## 2024-10-30 DIAGNOSIS — Z3A09 9 weeks gestation of pregnancy: Secondary | ICD-10-CM | POA: Diagnosis not present
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
## 2024-10-31 LAB — CBC/D/PLT+RPR+RH+ABO+RUBIGG...
Antibody Screen: NEGATIVE
Basophils Absolute: 0 x10E3/uL (ref 0.0–0.2)
Basos: 0 %
EOS (ABSOLUTE): 0.2 x10E3/uL (ref 0.0–0.4)
Eos: 3 %
HCV Ab: NONREACTIVE
HIV Screen 4th Generation wRfx: NONREACTIVE
Hematocrit: 39 % (ref 34.0–46.6)
Hemoglobin: 13 g/dL (ref 11.1–15.9)
Hepatitis B Surface Ag: NEGATIVE
Immature Grans (Abs): 0 x10E3/uL (ref 0.0–0.1)
Immature Granulocytes: 0 %
Lymphocytes Absolute: 2 x10E3/uL (ref 0.7–3.1)
Lymphs: 25 %
MCH: 29 pg (ref 26.6–33.0)
MCHC: 33.3 g/dL (ref 31.5–35.7)
MCV: 87 fL (ref 79–97)
Monocytes Absolute: 0.9 x10E3/uL (ref 0.1–0.9)
Monocytes: 11 %
Neutrophils Absolute: 4.9 x10E3/uL (ref 1.4–7.0)
Neutrophils: 61 %
Platelets: 337 x10E3/uL (ref 150–450)
RBC: 4.48 x10E6/uL (ref 3.77–5.28)
RDW: 13.1 % (ref 11.7–15.4)
RPR Ser Ql: NONREACTIVE
Rh Factor: POSITIVE
Rubella Antibodies, IGG: 3.96 {index}
WBC: 8.1 x10E3/uL (ref 3.4–10.8)

## 2024-10-31 LAB — HCV INTERPRETATION

## 2024-11-01 LAB — URINE CULTURE, OB REFLEX

## 2024-11-01 LAB — CULTURE, OB URINE

## 2024-11-03 ENCOUNTER — Ambulatory Visit: Payer: Self-pay | Admitting: Family Medicine

## 2024-11-03 DIAGNOSIS — Z3401 Encounter for supervision of normal first pregnancy, first trimester: Secondary | ICD-10-CM

## 2024-11-03 LAB — CERVICOVAGINAL ANCILLARY ONLY
Chlamydia: NEGATIVE
Comment: NEGATIVE
Comment: NORMAL
Neisseria Gonorrhea: NEGATIVE

## 2024-12-04 ENCOUNTER — Encounter: Admitting: Family Medicine
